# Patient Record
Sex: Female | Born: 1995 | Race: White | Hispanic: No | Marital: Single | Smoking: Current every day smoker
Health system: Southern US, Community
[De-identification: ages and names within clinical notes are randomized; demographics above are authoritative.]

## PROBLEM LIST (undated history)

## (undated) DIAGNOSIS — F79 Unspecified intellectual disabilities: Secondary | ICD-10-CM

## (undated) DIAGNOSIS — F4310 Post-traumatic stress disorder, unspecified: Secondary | ICD-10-CM

## (undated) DIAGNOSIS — F32A Depression, unspecified: Secondary | ICD-10-CM

## (undated) DIAGNOSIS — F419 Anxiety disorder, unspecified: Secondary | ICD-10-CM

## (undated) DIAGNOSIS — F329 Major depressive disorder, single episode, unspecified: Secondary | ICD-10-CM

## (undated) DIAGNOSIS — J189 Pneumonia, unspecified organism: Secondary | ICD-10-CM

## (undated) DIAGNOSIS — M25561 Pain in right knee: Secondary | ICD-10-CM

## (undated) HISTORY — DX: Pain in right knee: M25.561

## (undated) HISTORY — PX: MYRINGOTOMY: SUR874

---
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
## 2013-03-05 ENCOUNTER — Ambulatory Visit (INDEPENDENT_AMBULATORY_CARE_PROVIDER_SITE_OTHER): Payer: Medicaid Other | Admitting: General Practice

## 2013-03-05 ENCOUNTER — Encounter: Payer: Self-pay | Admitting: General Practice

## 2013-03-05 VITALS — BP 120/79 | HR 92 | Temp 98.0°F | Ht 63.0 in | Wt 103.0 lb

## 2013-03-05 DIAGNOSIS — M79641 Pain in right hand: Secondary | ICD-10-CM

## 2013-03-05 DIAGNOSIS — M79609 Pain in unspecified limb: Secondary | ICD-10-CM

## 2013-03-05 DIAGNOSIS — J45909 Unspecified asthma, uncomplicated: Secondary | ICD-10-CM

## 2013-03-05 MED ORDER — ALBUTEROL SULFATE HFA 108 (90 BASE) MCG/ACT IN AERS: 2.0000 | INHALATION_SPRAY | Freq: Four times a day (QID) | RESPIRATORY_TRACT | Status: DC | PRN

## 2013-03-05 NOTE — Progress Notes (Signed)
  Subjective:    Patient ID: Yvonne Meza, female    DOB: [birth year], 17 y.o.   MRN: [id number]  HPI Patient presents today with complaints of a knot on right hand 3rd finger. She reports hitting a wall three weeks ago, had some swelling which resolved. Then knot appeared 5 days ago and having some pain. She reports being able to make a fist, but painful. Denies numbness or tingling. Reports having asthma which is controlled with inhaler.     Review of Systems  Constitutional: Negative for fever and chills.  Respiratory: Negative for chest tightness and shortness of breath.   Cardiovascular: Negative for chest pain.  Musculoskeletal:       Right hand middle finger knot       Objective:   Physical Exam  Constitutional: She is oriented to person, place, and time. She appears well-developed and well-nourished.  Cardiovascular: Normal rate, regular rhythm and normal heart sounds.   Pulmonary/Chest: Effort normal and breath sounds normal.  Musculoskeletal: She exhibits tenderness.  Tenderness with palpation noted to right hand 3rd finger. Capillary refill less than 3 seconds. Able to make a fist.   Neurological: She is alert and oriented to person, place, and time.  Skin: Skin is warm and dry.  Psychiatric: She has a normal mood and affect.          Assessment & Plan:  1. Asthma - albuterol (VENTOLIN HFA) 108 (90 BASE) MCG/ACT inhaler; Inhale 2 puffs into the lungs every 6 (six) hours as needed. For shortness of breath  Dispense: 1 Inhaler; Refill: 6  2. Right hand pain - DG Hand Complete Right; Future (to return tomorrow for this xray) -refrain from strenuous activity if causes pain or discomfort -RTO if symptoms worsen, may seek emergency medical treatment -Patient verbalized understanding -Coralie Keens, FNP-C

## 2013-03-06 ENCOUNTER — Ambulatory Visit (INDEPENDENT_AMBULATORY_CARE_PROVIDER_SITE_OTHER): Payer: Medicaid Other | Admitting: *Deleted

## 2013-03-06 ENCOUNTER — Encounter: Payer: Self-pay | Admitting: Family Medicine

## 2013-03-06 DIAGNOSIS — M79609 Pain in unspecified limb: Secondary | ICD-10-CM

## 2013-03-06 DIAGNOSIS — M79641 Pain in right hand: Secondary | ICD-10-CM

## 2013-03-13 ENCOUNTER — Ambulatory Visit (INDEPENDENT_AMBULATORY_CARE_PROVIDER_SITE_OTHER): Payer: Medicaid Other | Admitting: General Practice

## 2013-03-13 ENCOUNTER — Encounter: Payer: Self-pay | Admitting: General Practice

## 2013-03-13 VITALS — BP 128/87 | HR 92 | Temp 98.5°F | Ht 63.0 in | Wt 106.0 lb

## 2013-03-13 DIAGNOSIS — M79644 Pain in right finger(s): Secondary | ICD-10-CM

## 2013-03-13 DIAGNOSIS — M79609 Pain in unspecified limb: Secondary | ICD-10-CM

## 2013-03-13 DIAGNOSIS — J45909 Unspecified asthma, uncomplicated: Secondary | ICD-10-CM

## 2013-03-13 NOTE — Progress Notes (Signed)
  Subjective:    Patient ID: Yvonne Meza, female    DOB: [birth year], 17 y.o.   MRN: [id number]  HPI Patient presents today for asthma action plan. She also reports right hand 3rd finger continues to be painful at times. She reports being able to make a fist. Also soaking in warm salt water soaks, taking naproxen and resting hand. Reports being seen at [facility] in the past for knee issues and would like to go back there.     Review of Systems  Constitutional: Negative for fever and chills.  Respiratory: Negative for cough, chest tightness and wheezing.   Cardiovascular: Negative for chest pain.  Musculoskeletal:       Right hand 3rd finger pain  Neurological: Negative for dizziness, weakness and headaches.       Objective:   Physical Exam  Constitutional: She is oriented to person, place, and time. She appears well-developed and well-nourished.  HENT:  Head: Normocephalic and atraumatic.  Right Ear: External ear normal.  Left Ear: External ear normal.  Nose: Nose normal.  Mouth/Throat: Oropharynx is clear and moist.  Eyes: EOM are normal. Pupils are equal, round, and reactive to light.  Neck: Normal range of motion. Neck supple. No thyromegaly present.  Cardiovascular: Normal rate, regular rhythm and normal heart sounds.   Pulmonary/Chest: Effort normal and breath sounds normal. No respiratory distress. She exhibits no tenderness.  Abdominal: Soft. Bowel sounds are normal. She exhibits no distension. There is no tenderness.  Musculoskeletal: She exhibits tenderness.  Tenderness upon palpation to right hand 3rd finger, less than 3 seconds capillary refill noted  Lymphadenopathy:    She has no cervical adenopathy.  Neurological: She is alert and oriented to person, place, and time.  Skin: Skin is warm and dry.  Psychiatric: She has a normal mood and affect.          Assessment & Plan:  1. Asthma - albuterol (VENTOLIN HFA) 108 (90 BASE) MCG/ACT inhaler; Inhale 2  puffs into the lungs every 6 (six) hours as needed. For shortness of breath  Dispense: 1 Inhaler; Refill: 6  2. Pain in finger of right hand - Ambulatory referral to Orthopedic Surgery -RTO if symptoms worsen  -may seek emergency medical treatment -Patient verbalized understanding -Coralie Keens, FNP-C

## 2013-03-14 MED ORDER — ALBUTEROL SULFATE HFA 108 (90 BASE) MCG/ACT IN AERS: 2.0000 | INHALATION_SPRAY | Freq: Four times a day (QID) | RESPIRATORY_TRACT | Status: DC | PRN

## 2013-10-13 ENCOUNTER — Ambulatory Visit (INDEPENDENT_AMBULATORY_CARE_PROVIDER_SITE_OTHER): Payer: Medicaid Other | Admitting: Family Medicine

## 2013-10-13 ENCOUNTER — Encounter: Payer: Self-pay | Admitting: Family Medicine

## 2013-10-13 VITALS — BP 115/77 | HR 78 | Temp 98.0°F | Ht 62.25 in | Wt 103.0 lb

## 2013-10-13 DIAGNOSIS — Z025 Encounter for examination for participation in sport: Secondary | ICD-10-CM

## 2013-10-13 DIAGNOSIS — Z0289 Encounter for other administrative examinations: Secondary | ICD-10-CM

## 2013-10-13 NOTE — Progress Notes (Signed)
Yvonne Meza is a 18 y.o. female who is here for a sports physical with her aunt.   To play volleyball Baseline hx/o ?mild MR and mood disorder. Followed by outpt psych.  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  Baseline asthma and intermittent smoker. Uses inhaler prior to exercise. Otherwise minimal use.   No family history of sickle cell disease. No family history of sudden cardiac death. No current medical concerns. Noted prior hx/o knee pain. Evaluated by ortho. No current knee complaints, though would like refill on pain medication for knee.  No history of concussion.  PHYSICAL EXAM:  Vital signs noted. HEENT: Within normal limits Neck: Within normal limits Lungs: Clear Heart: Regular rate and rhythm without murmur. Within normal limits. Abdomen: Negative Musculoskeletal and spine exam: Within normal limits. GU: (for Males only): Within normal limits. No hernia noted. Skin: Within normal limits  Assessment: Normal sports physical   Plan: Anticipatory guidance discussed with patient and parent(s).          Form completed, to be scanned into EMR chart.          Followup with PCP for ongoing preventive care and immunizations.  Use albuterol prior to exercise.   Follow up with orthopedics.           Please see the sports form for any further details.

## 2014-08-08 ENCOUNTER — Other Ambulatory Visit: Payer: Self-pay | Admitting: General Practice

## 2015-01-18 ENCOUNTER — Telehealth: Payer: Self-pay | Admitting: Nurse Practitioner

## 2015-01-18 NOTE — Telephone Encounter (Signed)
Patient aware.

## 2015-01-18 NOTE — Telephone Encounter (Signed)
Lmtcb, pt NTBS before a referral can be made since her last visit was over a yr ago

## 2015-01-22 ENCOUNTER — Encounter (INDEPENDENT_AMBULATORY_CARE_PROVIDER_SITE_OTHER): Payer: Self-pay

## 2015-01-22 ENCOUNTER — Encounter: Payer: Self-pay | Admitting: Family

## 2015-01-22 ENCOUNTER — Ambulatory Visit (INDEPENDENT_AMBULATORY_CARE_PROVIDER_SITE_OTHER): Payer: Medicaid Other | Admitting: Family

## 2015-01-22 VITALS — BP 116/73 | HR 81 | Temp 97.8°F | Ht 62.25 in | Wt 114.0 lb

## 2015-01-22 DIAGNOSIS — J4520 Mild intermittent asthma, uncomplicated: Secondary | ICD-10-CM | POA: Diagnosis not present

## 2015-01-22 DIAGNOSIS — M25561 Pain in right knee: Secondary | ICD-10-CM | POA: Diagnosis not present

## 2015-01-22 MED ORDER — ALBUTEROL SULFATE HFA 108 (90 BASE) MCG/ACT IN AERS: 2.0000 | INHALATION_SPRAY | Freq: Four times a day (QID) | RESPIRATORY_TRACT | Status: DC | PRN

## 2015-01-22 NOTE — Patient Instructions (Addendum)
Knee Pain The knee is the complex joint between your thigh and your lower leg. It is made up of bones, tendons, ligaments, and cartilage. The bones that make up the knee are:  The femur in the thigh.  The tibia and fibula in the lower leg.  The patella or kneecap riding in the groove on the lower femur. CAUSES  Knee pain is a common complaint with many causes. A few of these causes are:  Injury, such as:  A ruptured ligament or tendon injury.  Torn cartilage.  Medical conditions, such as:  Gout  Arthritis  Infections  Overuse, over training, or overdoing a physical activity. Knee pain can be minor or severe. Knee pain can accompany debilitating injury. Minor knee problems often respond well to self-care measures or get well on their own. More serious injuries may need medical intervention or even surgery. SYMPTOMS The knee is complex. Symptoms of knee problems can vary widely. Some of the problems are:  Pain with movement and weight bearing.  Swelling and tenderness.  Buckling of the knee.  Inability to straighten or extend your knee.  Your knee locks and you cannot straighten it.  Warmth and redness with pain and fever.  Deformity or dislocation of the kneecap. DIAGNOSIS  Determining what is wrong may be very straight forward such as when there is an injury. It can also be challenging because of the complexity of the knee. Tests to make a diagnosis may include:  Your caregiver taking a history and doing a physical exam.  Routine X-rays can be used to rule out other problems. X-rays will not reveal a cartilage tear. Some injuries of the knee can be diagnosed by:  Arthroscopy a surgical technique by which a small video camera is inserted through tiny incisions on the sides of the knee. This procedure is used to examine and repair internal knee joint problems. Tiny instruments can be used during arthroscopy to repair the torn knee cartilage (meniscus).  Arthrography  is a radiology technique. A contrast liquid is directly injected into the knee joint. Internal structures of the knee joint then become visible on X-ray film.  An MRI scan is a non X-ray radiology procedure in which magnetic fields and a computer produce two- or three-dimensional images of the inside of the knee. Cartilage tears are often visible using an MRI scanner. MRI scans have largely replaced arthrography in diagnosing cartilage tears of the knee.  Blood work.  Examination of the fluid that helps to lubricate the knee joint (synovial fluid). This is done by taking a sample out using a needle and a syringe. TREATMENT The treatment of knee problems depends on the cause. Some of these treatments are:  Depending on the injury, proper casting, splinting, surgery, or physical therapy care will be needed.  Give yourself adequate recovery time. Do not overuse your joints. If you begin to get sore during workout routines, back off. Slow down or do fewer repetitions.  For repetitive activities such as cycling or running, maintain your strength and nutrition.  Alternate muscle groups. For example, if you are a weight lifter, work the upper body on one day and the lower body the next.  Either tight or weak muscles do not give the proper support for your knee. Tight or weak muscles do not absorb the stress placed on the knee joint. Keep the muscles surrounding the knee strong.  Take care of mechanical problems.  If you have flat feet, orthotics or special shoes may help.  See your caregiver if you need help.  Arch supports, sometimes with wedges on the inner or outer aspect of the heel, can help. These can shift pressure away from the side of the knee most bothered by osteoarthritis.  A brace called an "unloader" brace also may be used to help ease the pressure on the most arthritic side of the knee.  If your caregiver has prescribed crutches, braces, wraps or ice, use as directed. The acronym  for this is PRICE. This means protection, rest, ice, compression, and elevation.  Nonsteroidal anti-inflammatory drugs (NSAIDs), can help relieve pain. But if taken immediately after an injury, they may actually increase swelling. Take NSAIDs with food in your stomach. Stop them if you develop stomach problems. Do not take these if you have a history of ulcers, stomach pain, or bleeding from the bowel. Do not take without your caregiver's approval if you have problems with fluid retention, heart failure, or kidney problems.  For ongoing knee problems, physical therapy may be helpful.  Glucosamine and chondroitin are over-the-counter dietary supplements. Both may help relieve the pain of osteoarthritis in the knee. These medicines are different from the usual anti-inflammatory drugs. Glucosamine may decrease the rate of cartilage destruction.  Injections of a corticosteroid drug into your knee joint may help reduce the symptoms of an arthritis flare-up. They may provide pain relief that lasts a few months. You may have to wait a few months between injections. The injections do have a small increased risk of infection, water retention, and elevated blood sugar levels.  Hyaluronic acid injected into damaged joints may ease pain and provide lubrication. These injections may work by reducing inflammation. A series of shots may give relief for as long as 6 months.  Topical painkillers. Applying certain ointments to your skin may help relieve the pain and stiffness of osteoarthritis. Ask your pharmacist for suggestions. Many over the-counter products are approved for temporary relief of arthritis pain.  In some countries, doctors often prescribe topical NSAIDs for relief of chronic conditions such as arthritis and tendinitis. A review of treatment with NSAID creams found that they worked as well as oral medications but without the serious side effects. PREVENTION  Maintain a healthy weight. Extra pounds  put more strain on your joints.  Get strong, stay limber. Weak muscles are a common cause of knee injuries. Stretching is important. Include flexibility exercises in your workouts.  Be smart about exercise. If you have osteoarthritis, chronic knee pain or recurring injuries, you may need to change the way you exercise. This does not mean you have to stop being active. If your knees ache after jogging or playing basketball, consider switching to swimming, water aerobics, or other low-impact activities, at least for a few days a week. Sometimes limiting high-impact activities will provide relief.  Make sure your shoes fit well. Choose footwear that is right for your sport.  Protect your knees. Use the proper gear for knee-sensitive activities. Use kneepads when playing volleyball or laying carpet. Buckle your seat belt every time you drive. Most shattered kneecaps occur in car accidents.  Rest when you are tired. SEEK MEDICAL CARE IF:  You have knee pain that is continual and does not seem to be getting better.  SEEK IMMEDIATE MEDICAL CARE IF:  Your knee joint feels hot to the touch and you have a high fever. MAKE SURE YOU:   Understand these instructions.  Will watch your condition.  Will get help right away if you are not  doing well or get worse. Document Released: 04/23/2007 Document Revised: 09/18/2011 Document Reviewed: 04/23/2007 [facility] Patient Information 2015 [place], [place]. This information is not intended to replace advice given to you by your health care provider. Make sure you discuss any questions you have with your health care provider. Asthma Attack Prevention Although there is no way to prevent asthma from starting, you can take steps to control the disease and reduce its symptoms. Learn about your asthma and how to control it. Take an active role to control your asthma by working with your health care provider to create and follow an asthma action plan. An asthma action  plan guides you in:  Taking your medicines properly.  Avoiding things that set off your asthma or make your asthma worse (asthma triggers).  Tracking your level of asthma control.  Responding to worsening asthma.  Seeking emergency care when needed. To track your asthma, keep records of your symptoms, check your peak flow number using a handheld device that shows how well air moves out of your lungs (peak flow meter), and get regular asthma checkups.  WHAT ARE SOME WAYS TO PREVENT AN ASTHMA ATTACK?  Take medicines as directed by your health care provider.  Keep track of your asthma symptoms and level of control.  With your health care provider, write a detailed plan for taking medicines and managing an asthma attack. Then be sure to follow your action plan. Asthma is an ongoing condition that needs regular monitoring and treatment.  Identify and avoid asthma triggers. Many outdoor allergens and irritants (such as pollen, mold, cold air, and air pollution) can trigger asthma attacks. Find out what your asthma triggers are and take steps to avoid them.  Monitor your breathing. Learn to recognize warning signs of an attack, such as coughing, wheezing, or shortness of breath. Your lung function may decrease before you notice any signs or symptoms, so regularly measure and record your peak airflow with a home peak flow meter.  Identify and treat attacks early. If you act quickly, you are less likely to have a severe attack. You will also need less medicine to control your symptoms. When your peak flow measurements decrease and alert you to an upcoming attack, take your medicine as instructed and immediately stop any activity that may have triggered the attack. If your symptoms do not improve, get medical help.  Pay attention to increasing quick-relief inhaler use. If you find yourself relying on your quick-relief inhaler, your asthma is not under control. See your health care provider about  adjusting your treatment. WHAT CAN MAKE MY SYMPTOMS WORSE? A number of common things can set off or make your asthma symptoms worse and cause temporary increased inflammation of your airways. Keep track of your asthma symptoms for several weeks, detailing all the environmental and emotional factors that are linked with your asthma. When you have an asthma attack, go back to your asthma diary to see which factor, or combination of factors, might have contributed to it. Once you know what these factors are, you can take steps to control many of them. If you have allergies and asthma, it is important to take asthma prevention steps at home. Minimizing contact with the substance to which you are allergic will help prevent an asthma attack. Some triggers and ways to avoid these triggers are: Animal Dander:  Some people are allergic to the flakes of skin or dried saliva from animals with fur or feathers.   There is no such thing as  a hypoallergenic dog or cat breed. All dogs or cats can cause allergies, even if they don't shed.  Keep these pets out of your home.  If you are not able to keep a pet outdoors, keep the pet out of your bedroom and other sleeping areas at all times, and keep the door closed.  Remove carpets and furniture covered with cloth from your home. If that is not possible, keep the pet away from fabric-covered furniture and carpets. Dust Mites: Many people with asthma are allergic to dust mites. Dust mites are tiny bugs that are found in every home in mattresses, pillows, carpets, fabric-covered furniture, bedcovers, clothes, stuffed toys, and other fabric-covered items.   Cover your mattress in a special dust-proof cover.  Cover your pillow in a special dust-proof cover, or wash the pillow each week in hot water. Water must be hotter than 130 F (54.4 C) to kill dust mites. Cold or warm water used with detergent and bleach can also be effective.  Wash the sheets and blankets on your  bed each week in hot water.  Try not to sleep or lie on cloth-covered cushions.  Call ahead when traveling and ask for a smoke-free hotel room. Bring your own bedding and pillows in case the hotel only supplies feather pillows and down comforters, which may contain dust mites and cause asthma symptoms.  Remove carpets from your bedroom and those laid on concrete, if you can.  Keep stuffed toys out of the bed, or wash the toys weekly in hot water or cooler water with detergent and bleach. Cockroaches: Many people with asthma are allergic to the droppings and remains of cockroaches.   Keep food and garbage in closed containers. Never leave food out.  Use poison baits, traps, powders, gels, or paste (for example, boric acid).  If a spray is used to kill cockroaches, stay out of the room until the odor goes away. Indoor Mold:  Fix leaky faucets, pipes, or other sources of water that have mold around them.  Clean floors and moldy surfaces with a fungicide or diluted bleach.  Avoid using humidifiers, vaporizers, or swamp coolers. These can spread molds through the air. Pollen and Outdoor Mold:  When pollen or mold spore counts are high, try to keep your windows closed.  Stay indoors with windows closed from late morning to afternoon. Pollen and some mold spore counts are highest at that time.  Ask your health care provider whether you need to take anti-inflammatory medicine or increase your dose of the medicine before your allergy season starts. Other Irritants to Avoid:  Tobacco smoke is an irritant. If you smoke, ask your health care provider how you can quit. Ask family members to quit smoking, too. Do not allow smoking in your home or car.  If possible, do not use a wood-burning stove, kerosene heater, or fireplace. Minimize exposure to all sources of smoke, including incense, candles, fires, and fireworks.  Try to stay away from strong odors and sprays, such as perfume, talcum  powder, hair spray, and paints.  Decrease humidity in your home and use an indoor air cleaning device. Reduce indoor humidity to below 60%. Dehumidifiers or central air conditioners can do this.  Decrease house dust exposure by changing furnace and air cooler filters frequently.  Try to have someone else vacuum for you once or twice a week. Stay out of rooms while they are being vacuumed and for a short while afterward.  If you vacuum, use a dust  mask from a hardware store, a double-layered or microfilter vacuum cleaner bag, or a vacuum cleaner with a HEPA filter.  Sulfites in foods and beverages can be irritants. Do not drink beer or wine or eat dried fruit, processed potatoes, or shrimp if they cause asthma symptoms.  Cold air can trigger an asthma attack. Cover your nose and mouth with a scarf on cold or windy days.  Several health conditions can make asthma more difficult to manage, including a runny nose, sinus infections, reflux disease, psychological stress, and sleep apnea. Work with your health care provider to manage these conditions.  Avoid close contact with people who have a respiratory infection such as a cold or the flu, since your asthma symptoms may get worse if you catch the infection. Wash your hands thoroughly after touching items that may have been handled by people with a respiratory infection.  Get a flu shot every year to protect against the flu virus, which often makes asthma worse for days or weeks. Also get a pneumonia shot if you have not previously had one. Unlike the flu shot, the pneumonia shot does not need to be given yearly. Medicines:  Talk to your health care provider about whether it is safe for you to take aspirin or non-steroidal anti-inflammatory medicines (NSAIDs). In a small number of people with asthma, aspirin and NSAIDs can cause asthma attacks. These medicines must be avoided by people who have known aspirin-sensitive asthma. It is important that  people with aspirin-sensitive asthma read labels of all over-the-counter medicines used to treat pain, colds, coughs, and fever.  Beta-blockers and ACE inhibitors are other medicines you should discuss with your health care provider. HOW CAN I FIND OUT WHAT I AM ALLERGIC TO? Ask your asthma health care provider about allergy skin testing or blood testing (the RAST test) to identify the allergens to which you are sensitive. If you are found to have allergies, the most important thing to do is to try to avoid exposure to any allergens that you are sensitive to as much as possible. Other treatments for allergies, such as medicines and allergy shots (immunotherapy) are available.  CAN I EXERCISE? Follow your health care provider's advice regarding asthma treatment before exercising. It is important to maintain a regular exercise program, but vigorous exercise or exercise in cold, humid, or dry environments can cause asthma attacks, especially for those people who have exercise-induced asthma. Document Released: 06/14/2009 Document Revised: 07/01/2013 Document Reviewed: 01/01/2013 [facility] Patient Information 2015 [place], [place]. This information is not intended to replace advice given to you by your health care provider. Make sure you discuss any questions you have with your health care provider.

## 2015-01-22 NOTE — Progress Notes (Signed)
Subjective:    Patient ID: Yvonne Meza, female    DOB: 1996/05/23, 19 y.o.   MRN: [id number]  Pt presents to the office for chronic knee pain. Mother states "bad knees" run in the family and she wants the pt to see an orthopedic. Pt states whenever she plays soccer her right knee hurts. Pt states she fell on it "years ago", and ever since then she has flare ups. Pt states her pain is intermittent 8 out 10.  Knee Pain  The incident occurred more than 1 week ago. The pain is present in the right knee. The pain is at a severity of 8/10. The pain is moderate. The pain has been intermittent since onset. She has tried NSAIDs for the symptoms. The treatment provided mild relief.  Asthma She complains of difficulty breathing ("a little"), frequent throat clearing and wheezing. There is no cough or shortness of breath. This is a chronic problem. The current episode started more than 1 year ago. The problem occurs intermittently. Pertinent negatives include no chest pain or headaches. Her symptoms are aggravated by pollen. Her symptoms are alleviated by rest and ipratropium. She reports significant improvement on treatment. Her past medical history is significant for asthma.      Review of Systems  Constitutional: Negative.   HENT: Negative.   Eyes: Negative.   Respiratory: Positive for wheezing. Negative for cough and shortness of breath.   Cardiovascular: Negative.  Negative for chest pain and palpitations.  Gastrointestinal: Negative.   Endocrine: Negative.   Genitourinary: Negative.   Musculoskeletal: Negative.   Neurological: Negative.  Negative for headaches.  Hematological: Negative.   Psychiatric/Behavioral: Negative.   All other systems reviewed and are negative.      Objective:   Physical Exam  Constitutional: She is oriented to person, place, and time. She appears well-developed and well-nourished. No distress.  HENT:  Head: Normocephalic and atraumatic.  Right Ear:  External ear normal.  Left Ear: External ear normal.  Nose: Nose normal.  Mouth/Throat: Oropharynx is clear and moist.  Eyes: Pupils are equal, round, and reactive to light.  Neck: Normal range of motion. Neck supple. No thyromegaly present.  Cardiovascular: Normal rate, regular rhythm, normal heart sounds and intact distal pulses.   No murmur heard. Pulmonary/Chest: Effort normal and breath sounds normal. No respiratory distress. She has no wheezes.  Abdominal: Soft. Bowel sounds are normal. She exhibits no distension. There is no tenderness.  Musculoskeletal: Normal range of motion. She exhibits no edema or tenderness.  Neurological: She is alert and oriented to person, place, and time. She has normal reflexes. No cranial nerve deficit.  Skin: Skin is warm and dry.  Psychiatric: She has a normal mood and affect. Her behavior is normal. Judgment and thought content normal.  Vitals reviewed.   BP 116/73 mmHg  Pulse 81  Temp(Src) 97.8 F (36.6 C) (Oral)  Ht 5' 2.25" (1.581 m)  Wt 114 lb (51.71 kg)  BMI 20.69 kg/m2  LMP 01/15/2015 (Approximate)       Assessment & Plan:  1. Right knee pain -Rest -Motrin as needed for pain -I do not believe there is any injury or problems- but mom would like referral to ortho -RTO prn - Ambulatory referral to Orthopedic Surgery   2. Asthma, mild intermittent, uncomplicated -Avoid allergens when possible - albuterol (VENTOLIN HFA) 108 (90 BASE) MCG/ACT inhaler; Inhale 2 puffs into the lungs every 6 (six) hours as needed. For shortness of breath  Dispense: 1 Inhaler; Refill: 6  Evelina Dun, FNP

## 2015-03-29 ENCOUNTER — Telehealth: Payer: Self-pay | Admitting: Family

## 2015-04-02 ENCOUNTER — Ambulatory Visit (INDEPENDENT_AMBULATORY_CARE_PROVIDER_SITE_OTHER): Payer: Medicaid Other | Admitting: Physician Assistant

## 2015-04-02 ENCOUNTER — Encounter: Payer: Self-pay | Admitting: Physician Assistant

## 2015-04-02 VITALS — BP 128/81 | HR 93 | Ht 62.25 in | Wt 106.0 lb

## 2015-04-02 DIAGNOSIS — Z0279 Encounter for issue of other medical certificate: Secondary | ICD-10-CM

## 2015-04-02 DIAGNOSIS — Z0189 Encounter for other specified special examinations: Secondary | ICD-10-CM

## 2015-04-02 NOTE — Telephone Encounter (Signed)
Pt is being seen today, to have form filled out

## 2015-04-09 ENCOUNTER — Encounter: Payer: Self-pay | Admitting: Physician Assistant

## 2015-04-09 NOTE — Progress Notes (Signed)
Patient ID: Yvonne Meza, female   DOB: 1996-06-15, 19 y.o.   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 Her parents have guardianship.   [name] presents with her female friend, who she lives with. During visit, I am told by her and her friend that the parents receive her social security check and claims that the patient do not use the money for purposes related to the patient. [name] does not feel that her parents should receive the check or be guardian because she feels she is capable of managing her own finances. She supports this by stating that she graduated from high school and should be able to make her own decisions.   [name] was able to answer all questions asked and appeared mentally competent during the exam. Her answers were reasonable and did not appear out of context.   This was my first time meeting [name]. Based on today's exam, I feel that she is mentally competent, however, further evaluation may be needed.   Tiffany A. Chauncey Reading PA-C

## 2015-06-12 ENCOUNTER — Ambulatory Visit: Payer: Medicaid Other

## 2015-06-14 ENCOUNTER — Ambulatory Visit: Payer: Medicaid Other | Admitting: Family

## 2015-06-18 ENCOUNTER — Ambulatory Visit: Payer: Medicaid Other | Admitting: Pediatrics

## 2015-06-21 ENCOUNTER — Encounter: Payer: Self-pay | Admitting: Family

## 2015-07-01 ENCOUNTER — Emergency Department (HOSPITAL_COMMUNITY)
Admission: EM | Admit: 2015-07-01 | Discharge: 2015-07-01 | Disposition: A | Discharge status: Home / Self Care | Payer: Medicaid Other | Attending: Emergency Medicine | Admitting: Emergency Medicine

## 2015-07-01 ENCOUNTER — Encounter (HOSPITAL_COMMUNITY): Payer: Self-pay | Admitting: *Deleted

## 2015-07-01 DIAGNOSIS — N12 Tubulo-interstitial nephritis, not specified as acute or chronic: Secondary | ICD-10-CM | POA: Diagnosis not present

## 2015-07-01 DIAGNOSIS — R000 Tachycardia, unspecified: Secondary | ICD-10-CM | POA: Diagnosis not present

## 2015-07-01 DIAGNOSIS — F17210 Nicotine dependence, cigarettes, uncomplicated: Secondary | ICD-10-CM | POA: Diagnosis not present

## 2015-07-01 DIAGNOSIS — J45909 Unspecified asthma, uncomplicated: Secondary | ICD-10-CM | POA: Diagnosis not present

## 2015-07-01 DIAGNOSIS — Z8659 Personal history of other mental and behavioral disorders: Secondary | ICD-10-CM | POA: Insufficient documentation

## 2015-07-01 DIAGNOSIS — Z8701 Personal history of pneumonia (recurrent): Secondary | ICD-10-CM | POA: Diagnosis not present

## 2015-07-01 DIAGNOSIS — R319 Hematuria, unspecified: Secondary | ICD-10-CM | POA: Diagnosis present

## 2015-07-01 LAB — URINE MICROSCOPIC-ADD ON

## 2015-07-01 LAB — CBC WITH DIFFERENTIAL/PLATELET
Basophils Absolute: 0 10*3/uL (ref 0.0–0.1)
Basophils Relative: 0 %
EOS PCT: 0 %
Eosinophils Absolute: 0 10*3/uL (ref 0.0–0.7)
HCT: 42.7 % (ref 36.0–46.0)
HEMOGLOBIN: 14.4 g/dL (ref 12.0–15.0)
LYMPHS ABS: 2.3 10*3/uL (ref 0.7–4.0)
LYMPHS PCT: 15 %
MCH: 32.1 pg (ref 26.0–34.0)
MCHC: 33.7 g/dL (ref 30.0–36.0)
MCV: 95.3 fL (ref 78.0–100.0)
MONOS PCT: 7 %
Monocytes Absolute: 1.1 10*3/uL — ABNORMAL HIGH (ref 0.1–1.0)
NEUTROS ABS: 12.2 10*3/uL — AB (ref 1.7–7.7)
NEUTROS PCT: 78 %
Platelets: 299 10*3/uL (ref 150–400)
RBC: 4.48 MIL/uL (ref 3.87–5.11)
RDW: 12.8 % (ref 11.5–15.5)
WBC: 15.7 10*3/uL — AB (ref 4.0–10.5)

## 2015-07-01 LAB — URINALYSIS, ROUTINE W REFLEX MICROSCOPIC
Bilirubin Urine: NEGATIVE
Glucose, UA: NEGATIVE mg/dL
Ketones, ur: NEGATIVE mg/dL
NITRITE: NEGATIVE
PH: 6 (ref 5.0–8.0)
Protein, ur: NEGATIVE mg/dL
SPECIFIC GRAVITY, URINE: 1.01 (ref 1.005–1.030)

## 2015-07-01 LAB — BASIC METABOLIC PANEL
Anion gap: 7 (ref 5–15)
BUN: 9 mg/dL (ref 6–20)
CHLORIDE: 107 mmol/L (ref 101–111)
CO2: 23 mmol/L (ref 22–32)
Calcium: 9.3 mg/dL (ref 8.9–10.3)
Creatinine, Ser: 0.6 mg/dL (ref 0.44–1.00)
GFR calc Af Amer: >60 mL/min (ref >60)
GFR calc non Af Amer: >60 mL/min (ref >60)
GLUCOSE: 120 mg/dL — AB (ref 65–99)
POTASSIUM: 3.6 mmol/L (ref 3.5–5.1)
Sodium: 137 mmol/L (ref 135–145)

## 2015-07-01 MED ORDER — PHENAZOPYRIDINE HCL 200 MG PO TABS: 200.0000 mg | ORAL_TABLET | Freq: Three times a day (TID) | ORAL | Status: DC

## 2015-07-01 MED ORDER — PHENAZOPYRIDINE HCL 100 MG PO TABS
200.0000 mg | ORAL_TABLET | Freq: Once | ORAL | Status: AC
  Administered 2015-07-01: 200 mg via ORAL
  Filled 2015-07-01: qty 2

## 2015-07-01 MED ORDER — SODIUM CHLORIDE 0.9 % IV BOLUS (SEPSIS)
500.0000 mL | Freq: Once | INTRAVENOUS | Status: AC
  Administered 2015-07-01: 500 mL via INTRAVENOUS

## 2015-07-01 MED ORDER — ONDANSETRON 4 MG PO TBDP: 4.0000 mg | ORAL_TABLET | Freq: Three times a day (TID) | ORAL | Status: DC | PRN

## 2015-07-01 MED ORDER — CEPHALEXIN 500 MG PO CAPS: 500.0000 mg | ORAL_CAPSULE | Freq: Three times a day (TID) | ORAL | Status: DC

## 2015-07-01 MED ORDER — ONDANSETRON HCL 4 MG/2ML IJ SOLN
4.0000 mg | Freq: Once | INTRAMUSCULAR | Status: AC
  Administered 2015-07-01: 4 mg via INTRAVENOUS
  Filled 2015-07-01: qty 2

## 2015-07-01 MED ORDER — DEXTROSE 5 % IV SOLN
1.0000 g | Freq: Once | INTRAVENOUS | Status: AC
  Administered 2015-07-01: 1 g via INTRAVENOUS
  Filled 2015-07-01: qty 10

## 2015-07-01 NOTE — ED Notes (Signed)
Pt states, " I have blood in my urine."  Pt also states abdominal pain. Pt also states lightheadedness at times but not at present.

## 2015-07-01 NOTE — ED Provider Notes (Signed)
CSN: [id number]     Arrival date & time 07/01/15  1733 History   First MD Initiated Contact with Patient 07/01/15 1740     Chief Complaint  Patient presents with  . Hematuria  . Abdominal Pain     (Consider location/radiation/quality/duration/timing/severity/associated sxs/prior Treatment) Patient is a 19 y.o. female presenting with dysuria. The history is provided by the patient.  Dysuria Pain quality:  Burning Pain severity:  Moderate Onset quality:  Gradual Duration:  2 days Timing:  Intermittent Progression:  Worsening Chronicity:  New Recent urinary tract infections: yes   Relieved by:  Nothing Worsened by:  Nothing tried Ineffective treatments:  None tried Urinary symptoms: discolored urine, foul-smelling urine, frequent urination and hematuria   Urinary symptoms: no bladder incontinence   Associated symptoms: fever (low grade), flank pain and nausea   Associated symptoms: no genital lesions, no vaginal discharge and no vomiting   Risk factors: no hx of pyelonephritis, no hx of urolithiasis, not pregnant and not sexually active    Yvonne Meza is a 19 y.o. female who presents to the ED with hematuria and dysuria that stated yesterday. Last UTI was in November. Patient denies being sexually active and denies vaginal bleeding or discharge.   Past Medical History  Diagnosis Date  . Asthma   . Pneumonia     episode as a child  . MR (mental retardation)   . Depression   . Right knee pain    Past Surgical History  Procedure Laterality Date  . Myringotomy     Family History  Problem Relation Age of Onset  . Diabetes Other   . Heart failure Other   . Diabetes Mother   . Hypertension Mother   . Hyperlipidemia Mother   . Fibromyalgia Mother    Social History  Substance Use Topics  . Smoking status: Current Every Day Smoker -- 0.25 packs/day for 7 years    Types: Cigarettes  . Smokeless tobacco: Never Used  . Alcohol Use: No   OB History    No data  available     Review of Systems  Constitutional: Positive for fever (low grade) and chills.  HENT: Negative.   Eyes: Negative for pain, redness, itching and visual disturbance.  Respiratory: Negative for cough, shortness of breath and wheezing.   Gastrointestinal: Positive for nausea. Negative for vomiting.  Genitourinary: Positive for dysuria and flank pain. Negative for vaginal discharge.  Musculoskeletal: Positive for back pain.  Skin: Negative for rash.  Neurological: Negative for syncope, light-headedness and headaches.  Psychiatric/Behavioral: Negative for confusion. The patient is not nervous/anxious.       Allergies  Sulfa antibiotics  Home Medications   Prior to Admission medications   Medication Sig Start Date End Date Taking? Authorizing Provider  albuterol (VENTOLIN HFA) 108 (90 BASE) MCG/ACT inhaler Inhale 2 puffs into the lungs every 6 (six) hours as needed. For shortness of breath Patient not taking: Reported on 07/01/2015 01/22/15   Junie Spencer, FNP  cephALEXin (KEFLEX) 500 MG capsule Take 1 capsule (500 mg total) by mouth 3 (three) times daily. 07/01/15   Hope Orlene Och, NP  ondansetron (ZOFRAN ODT) 4 MG disintegrating tablet Take 1 tablet (4 mg total) by mouth every 8 (eight) hours as needed for nausea or vomiting. 07/01/15   Hope Orlene Och, NP  phenazopyridine (PYRIDIUM) 200 MG tablet Take 1 tablet (200 mg total) by mouth 3 (three) times daily. 07/01/15   Hope Orlene Och, NP   BP 115/72 mmHg  Pulse 94  Temp(Src) 98.2 F (36.8 C) (Oral)  Resp 16  Ht  (1.575 m)  Wt 52.164 kg  BMI 21.03 kg/m2  SpO2 100%  LMP 06/09/2015 Physical Exam  Constitutional: She is oriented to person, place, and time. She appears well-developed and well-nourished.  HENT:  Head: Normocephalic and atraumatic.  Eyes: Conjunctivae and EOM are normal. Pupils are equal, round, and reactive to light.  Neck: Normal range of motion. Neck supple.  Cardiovascular: Regular rhythm.   Tachycardia present.   Pulmonary/Chest: Effort normal and breath sounds normal.  Abdominal: Soft. Bowel sounds are normal. There is tenderness in the suprapubic area. There is CVA tenderness (right). There is no rebound and no guarding.  Genitourinary:  Patient declined pelvic exam stating she is not sexually active and no bleeding or discharge.  Musculoskeletal: Normal range of motion.  Neurological: She is alert and oriented to person, place, and time. No cranial nerve deficit.  Skin: Skin is warm and dry.  Psychiatric: She has a normal mood and affect. Her behavior is normal.  Nursing note and vitals reviewed.   ED Course  Procedures (including critical care time) IV fluid bolus of NS, Rocephin 1 gram IV, Zofran 4 mg IV  Labs Review Results for orders placed or performed during the hospital encounter of 07/01/15 (from the past 24 hour(s))  Urinalysis, Routine w reflex microscopic (not at [facility])     Status: Abnormal   Collection Time: 07/01/15  6:00 PM  Result Value Ref Range   Color, Urine YELLOW YELLOW   APPearance HAZY (A) CLEAR   Specific Gravity, Urine 1.010 1.005 - 1.030   pH 6.0 5.0 - 8.0   Glucose, UA NEGATIVE NEGATIVE mg/dL   Hgb urine dipstick LARGE (A) NEGATIVE   Bilirubin Urine NEGATIVE NEGATIVE   Ketones, ur NEGATIVE NEGATIVE mg/dL   Protein, ur NEGATIVE NEGATIVE mg/dL   Nitrite NEGATIVE NEGATIVE   Leukocytes, UA MODERATE (A) NEGATIVE  Urine microscopic-add on     Status: Abnormal   Collection Time: 07/01/15  6:00 PM  Result Value Ref Range   Squamous Epithelial / LPF 6-30 (A) NONE SEEN   WBC, UA TOO NUMEROUS TO COUNT 0 - 5 WBC/hpf   RBC / HPF 6-30 0 - 5 RBC/hpf   Bacteria, UA MANY (A) NONE SEEN  CBC with Differential     Status: Abnormal   Collection Time: 07/01/15  6:02 PM  Result Value Ref Range   WBC 15.7 (H) 4.0 - 10.5 K/uL   RBC 4.48 3.87 - 5.11 MIL/uL   Hemoglobin 14.4 12.0 - 15.0 g/dL   HCT 09.8 11.9 - 14.7 %   MCV 95.3 78.0 - 100.0 fL   MCH 32.1  26.0 - 34.0 pg   MCHC 33.7 30.0 - 36.0 g/dL   RDW 82.9 56.2 - 13.0 %   Platelets 299 150 - 400 K/uL   Neutrophils Relative % 78 %   Neutro Abs 12.2 (H) 1.7 - 7.7 K/uL   Lymphocytes Relative 15 %   Lymphs Abs 2.3 0.7 - 4.0 K/uL   Monocytes Relative 7 %   Monocytes Absolute 1.1 (H) 0.1 - 1.0 K/uL   Eosinophils Relative 0 %   Eosinophils Absolute 0.0 0.0 - 0.7 K/uL   Basophils Relative 0 %   Basophils Absolute 0.0 0.0 - 0.1 K/uL  Basic metabolic panel     Status: Abnormal   Collection Time: 07/01/15  6:02 PM  Result Value Ref Range   Sodium 137 135 -  145 mmol/L   Potassium 3.6 3.5 - 5.1 mmol/L   Chloride 107 101 - 111 mmol/L   CO2 23 22 - 32 mmol/L   Glucose, Bld 120 (H) 65 - 99 mg/dL   BUN 9 6 - 20 mg/dL   Creatinine, Ser 1.610.60 0.44 - 1.00 mg/dL   Calcium 9.3 8.9 - 09.610.3 mg/dL   GFR calc non Af Amer >60 >60 mL/min   GFR calc Af Amer >60 >60 mL/min   Anion gap 7 5 - 15    Patient's vital signs improved after IV fluids, pyridium, zofran and Rocephin.   MDM  19 y.o. female with hematuria, dysuria and right CVAT stable for d/c with outpatient treatment of pyelonephritis without fever and does not appear toxic.  Urine culture pending. Discussed with the patient clinical and lab findings and plan of care and all questioned fully answered. She will return if any problems arise.   Final diagnoses:  Pyelonephritis        Janne NapoleonHope M Neese, NP 07/01/15 2039  Glynn OctaveStephen Rancour, MD 07/02/15 [phone]

## 2015-07-01 NOTE — Discharge Instructions (Signed)
Call the Urologist office for follow up. Return here as needed for worsening symptoms.  We have sent the urine for culture. If we need to change your medication someone will call you.   Pyelonephritis, Adult Pyelonephritis is a kidney infection. The kidneys are the organs that filter a person's blood and move waste out of the bloodstream and into the urine. Urine passes from the kidneys, through the ureters, and into the bladder. There are two main types of pyelonephritis:  Infections that come on quickly without any warning (acute pyelonephritis).  Infections that last for a long period of time (chronic pyelonephritis). In most cases, the infection clears up with treatment and does not cause further problems. More severe infections or chronic infections can sometimes spread to the bloodstream or lead to other problems with the kidneys. CAUSES This condition is usually caused by:  Bacteria traveling from the bladder to the kidney through infected urine. The urine in the bladder can become infected with bacteria from:  Bladder infection (cystitis).  Inflammation of the prostate gland (prostatitis).  Sexual intercourse, in females.  Bacteria traveling from the bloodstream to the kidney. RISK FACTORS This condition is more likely to develop in:  Pregnant women.  Older people.  People who have diabetes.  People who have kidney stones or bladder stones.  People who have other abnormalities of the kidney or ureter.  People who have a catheter placed in the bladder.  People who have cancer.  People who are sexually active.  Women who use spermicides.  People who have had a prior urinary tract infection. SYMPTOMS Symptoms of this condition include:  Frequent urination.  Strong or persistent urge to urinate.  Burning or stinging when urinating.  Abdominal pain.  Back pain.  Pain in the side or flank area.  Fever.  Chills.  Blood in the urine, or dark  urine.  Nausea.  Vomiting. DIAGNOSIS This condition may be diagnosed based on:  Medical history and physical exam.  Urine tests.  Blood tests. You may also have imaging tests of the kidneys, such as an ultrasound or CT scan. TREATMENT Treatment for this condition may depend on the severity of the infection.  If the infection is mild and is found early, you may be treated with antibiotic medicines taken by mouth. You will need to drink fluids to remain hydrated.  If the infection is more severe, you may need to stay in the hospital and receive antibiotics given directly into a vein through an IV tube. You may also need to receive fluids through an IV tube if you are not able to remain hydrated. After your hospital stay, you may need to take oral antibiotics for a period of time. Other treatments may be required, depending on the cause of the infection. HOME CARE INSTRUCTIONS Medicines  Take over-the-counter and prescription medicines only as told by your health care provider.  If you were prescribed an antibiotic medicine, take it as told by your health care provider. Do not stop taking the antibiotic even if you start to feel better. General Instructions  Drink enough fluid to keep your urine clear or pale yellow.  Avoid caffeine, tea, and carbonated beverages. They tend to irritate the bladder.  Urinate often. Avoid holding in urine for long periods of time.  Urinate before and after sex.  After a bowel movement, women should cleanse from front to back. Use each tissue only once.  Keep all follow-up visits as told by your health care provider. This  is important. SEEK MEDICAL CARE IF:  Your symptoms do not get better after 2 days of treatment.  Your symptoms get worse.  You have a fever. SEEK IMMEDIATE MEDICAL CARE IF:  You are unable to take your antibiotics or fluids.  You have shaking chills.  You vomit.  You have severe flank or back pain.  You have  extreme weakness or fainting.   This information is not intended to replace advice given to you by your health care provider. Make sure you discuss any questions you have with your health care provider.   Document Released: 06/26/2005 Document Revised: 03/17/2015 Document Reviewed: 10/19/2014 Elsevier Interactive Patient Education [employer].

## 2015-07-04 LAB — URINE CULTURE

## 2015-07-05 ENCOUNTER — Telehealth: Payer: Self-pay | Admitting: *Deleted

## 2015-07-05 NOTE — ED Notes (Signed)
(+)  Urine Culture, treated with Cephalexin, no further treatment needed, Trixie DredgeEmily West, PA-C

## 2015-07-05 NOTE — ED Notes (Signed)
(+)  Urine culture, treated with Cephalexin, no additional treatment needed, Trixie DredgeEmily West, PA-C

## 2015-07-06 ENCOUNTER — Telehealth: Payer: Self-pay | Admitting: Family

## 2015-07-14 ENCOUNTER — Ambulatory Visit (INDEPENDENT_AMBULATORY_CARE_PROVIDER_SITE_OTHER): Payer: Medicaid Other | Admitting: Pediatrics

## 2015-07-14 ENCOUNTER — Encounter: Payer: Self-pay | Admitting: Pediatrics

## 2015-07-14 VITALS — BP 129/84 | HR 107 | Temp 97.3°F | Ht 62.0 in | Wt 108.6 lb

## 2015-07-14 DIAGNOSIS — R399 Unspecified symptoms and signs involving the genitourinary system: Secondary | ICD-10-CM

## 2015-07-14 DIAGNOSIS — R319 Hematuria, unspecified: Secondary | ICD-10-CM

## 2015-07-14 LAB — POCT UA - MICROSCOPIC ONLY
BACTERIA, U MICROSCOPIC: NEGATIVE
Casts, Ur, LPF, POC: NEGATIVE
Crystals, Ur, HPF, POC: NEGATIVE
Yeast, UA: NEGATIVE

## 2015-07-14 LAB — POCT URINALYSIS DIPSTICK
BILIRUBIN UA: NEGATIVE
Glucose, UA: NEGATIVE
Ketones, UA: NEGATIVE
LEUKOCYTES UA: NEGATIVE
NITRITE UA: NEGATIVE
PH UA: 7.5
Spec Grav, UA: <=1.005
UROBILINOGEN UA: NEGATIVE

## 2015-07-14 NOTE — Progress Notes (Signed)
Subjective:    Patient ID: Yvonne Meza, female    DOB: [birth year], 20 y.o.   MRN: [id number]  CC: Hospitalization Follow-up for UTI  HPI: Yvonne Meza is a 20 y.o. female presenting for Hospitalization Follow-up  Seen in ED for pyelonephritis 2 weeks ago, treated with keflex for 2 weeks for staph epi UTI. Still has a couple days of antibiotics  Still having pink/red urine. Not on period LMP: Nov 2016, periods are irregular, sometimes multiple in a month, sometimes goes two months without one No vaginal bleeding now Still having some chills Still with some back pain Doesn't have thermometer at home, thinks she still has some fevers at times Not feeling back to her normal self yet. Appetite still down Pt not sexually active  Depression screen PHQ 2/9 07/14/2015  Decreased Interest 0  Down, Depressed, Hopeless 0  PHQ - 2 Score 0     Relevant past medical, surgical, family and social history reviewed and updated as indicated. Interim medical history since our last visit reviewed. Allergies and medications reviewed and updated.    ROS: Per HPI unless specifically indicated above  History  Smoking status  . Current Every Day Smoker -- 0.25 packs/day for 7 years  . Types: Cigarettes  Smokeless tobacco  . Never Used    Past Medical History Patient Active Problem List   Diagnosis Date Noted  . Unspecified asthma(493.90) 12/11/2012    Current Outpatient Prescriptions  Medication Sig Dispense Refill  . albuterol (VENTOLIN HFA) 108 (90 BASE) MCG/ACT inhaler Inhale 2 puffs into the lungs every 6 (six) hours as needed. For shortness of breath 1 Inhaler 6  . ondansetron (ZOFRAN ODT) 4 MG disintegrating tablet Take 1 tablet (4 mg total) by mouth every 8 (eight) hours as needed for nausea or vomiting. 20 tablet 0  . phenazopyridine (PYRIDIUM) 200 MG tablet Take 1 tablet (200 mg total) by mouth 3 (three) times daily. 6 tablet 0   No current facility-administered  medications for this visit.       Objective:    BP 129/84 mmHg  Pulse 107  Temp(Src) 97.3 F (36.3 C) (Oral)  Ht _0  (1.575 m)  Wt 108 lb 9.6 oz (49.261 kg)  BMI 19.86 kg/m2  LMP 06/09/2015  Wt Readings from Last 3 Encounters:  07/14/15 108 lb 9.6 oz (49.261 kg)  07/01/15 115 lb (52.164 kg)  04/02/15 106 lb (48.081 kg)     Gen: NAD, alert, cooperative with exam, NCAT EYES: EOMI, no scleral injection or icterus ENT: OP without erythema LYMPH: no cervical LAD CV: NRRR, normal S1/S2, no murmur, distal pulses 2+ b/l Resp: CTABL, no wheezes, normal WOB Abd: +BS, soft, mildly tender throughout, ND. no guarding or organomegaly, mild CVA tenderness b/l Ext: No edema, warm Neuro: Alert and oriented, strength equal b/l UE and LE, coordination grossly normal MSK: normal muscle bulk     Assessment & Plan:    Idora was seen today for hospitalization follow-up, f/u pyelonephrotos. Still not feeling abck to normal self. Noticing pink urine. WIll repeat UA/U culture today, send CBC and BMP. Finish current antibiotics, let me know if worsening. May take another coupel of weeks for urine to clear RBCs after pyelo.   Diagnoses and all orders for this visit:  Hematuria -     Urine culture -     POCT urinalysis dipstick -     POCT UA - Microscopic Only -     BMP8+EGFR -  CBC    Follow up plan: Return in about 4 weeks (around 08/11/2015) for f/u irregular periods, repeat UA to show clearing of RBCs  Assunta Found, MD [facility] 07/14/2015, 12:13 PM

## 2015-07-15 LAB — BMP8+EGFR
BUN / CREAT RATIO: 11 (ref 8–20)
BUN: 6 mg/dL (ref 6–20)
CO2: 22 mmol/L (ref 18–29)
CREATININE: 0.54 mg/dL — AB (ref 0.57–1.00)
Calcium: 9.6 mg/dL (ref 8.7–10.2)
Chloride: 100 mmol/L (ref 96–106)
GFR calc Af Amer: 156 mL/min/{1.73_m2} (ref >59)
GFR, EST NON AFRICAN AMERICAN: 135 mL/min/{1.73_m2} (ref >59)
GLUCOSE: 80 mg/dL (ref 65–99)
POTASSIUM: 4.9 mmol/L (ref 3.5–5.2)
SODIUM: 138 mmol/L (ref 134–144)

## 2015-07-15 LAB — CBC
HEMATOCRIT: 41.1 % (ref 34.0–46.6)
Hemoglobin: 13.6 g/dL (ref 11.1–15.9)
MCH: 31 pg (ref 26.6–33.0)
MCHC: 33.1 g/dL (ref 31.5–35.7)
MCV: 94 fL (ref 79–97)
PLATELETS: 301 10*3/uL (ref 150–379)
RBC: 4.39 x10E6/uL (ref 3.77–5.28)
RDW: 12.6 % (ref 12.3–15.4)
WBC: 7.3 10*3/uL (ref 3.4–10.8)

## 2015-07-15 LAB — URINE CULTURE: Organism ID, Bacteria: NO GROWTH

## 2015-09-06 ENCOUNTER — Encounter (HOSPITAL_COMMUNITY): Payer: Self-pay | Admitting: *Deleted

## 2015-09-06 ENCOUNTER — Emergency Department (HOSPITAL_COMMUNITY)
Admission: EM | Admit: 2015-09-06 | Discharge: 2015-09-06 | Disposition: A | Discharge status: Home / Self Care | Payer: Medicaid Other | Attending: Emergency Medicine | Admitting: Emergency Medicine

## 2015-09-06 DIAGNOSIS — Z8739 Personal history of other diseases of the musculoskeletal system and connective tissue: Secondary | ICD-10-CM | POA: Insufficient documentation

## 2015-09-06 DIAGNOSIS — Z8701 Personal history of pneumonia (recurrent): Secondary | ICD-10-CM | POA: Diagnosis not present

## 2015-09-06 DIAGNOSIS — J45901 Unspecified asthma with (acute) exacerbation: Secondary | ICD-10-CM | POA: Insufficient documentation

## 2015-09-06 DIAGNOSIS — F17210 Nicotine dependence, cigarettes, uncomplicated: Secondary | ICD-10-CM | POA: Insufficient documentation

## 2015-09-06 DIAGNOSIS — J069 Acute upper respiratory infection, unspecified: Secondary | ICD-10-CM | POA: Diagnosis not present

## 2015-09-06 DIAGNOSIS — Z8659 Personal history of other mental and behavioral disorders: Secondary | ICD-10-CM | POA: Insufficient documentation

## 2015-09-06 DIAGNOSIS — R05 Cough: Secondary | ICD-10-CM | POA: Diagnosis present

## 2015-09-06 MED ORDER — GUAIFENESIN-CODEINE 100-10 MG/5ML PO SYRP: 10.0000 mL | ORAL_SOLUTION | Freq: Three times a day (TID) | ORAL | Status: DC | PRN

## 2015-09-06 MED ORDER — ALBUTEROL SULFATE HFA 108 (90 BASE) MCG/ACT IN AERS
1.0000 | INHALATION_SPRAY | Freq: Four times a day (QID) | RESPIRATORY_TRACT | Status: DC | PRN
Start: 2015-09-06 — End: 2017-10-04

## 2015-09-06 MED ORDER — NAPROXEN 500 MG PO TABS: 500.0000 mg | ORAL_TABLET | Freq: Two times a day (BID) | ORAL | Status: DC

## 2015-09-06 NOTE — ED Notes (Signed)
Pt with sore throat and body aches for several weeks- Clear voiced and NAD

## 2015-09-06 NOTE — Discharge Instructions (Signed)

## 2015-09-06 NOTE — ED Notes (Signed)
Pt comes in with cough and sore throat starting last week. Pt denies any n/v/d.

## 2015-09-06 NOTE — ED Provider Notes (Signed)
CSN: [id number]     Arrival date & time 09/06/15  1344 History  By signing my name below, I, Emmanuella Mensah, attest that this documentation has been prepared under the direction and in the presence of Treonna Klee, PA-C. Electronically Signed: Angelene Giovanni, ED Scribe. 09/06/2015. 2:12 PM.      Chief Complaint  Patient presents with  . Cough   Patient is a 20 y.o. female presenting with cough. The history is provided by the patient. No language interpreter was used.  Cough Cough characteristics:  Non-productive Severity:  Moderate Onset quality:  Gradual Duration:  1 week Timing:  Intermittent Progression:  Worsening Chronicity:  New Context: not sick contacts   Relieved by:  Nothing Worsened by:  Nothing tried Ineffective treatments:  Beta-agonist inhaler Associated symptoms: chills, fever, myalgias, shortness of breath and sore throat   Associated symptoms: no chest pain, no diaphoresis, no ear pain and no rash    HPI Comments: Yvonne Meza is a 20 y.o. female with a hx of asthma who presents to the Emergency Department complaining of gradually worsening persistent moderate non-productive cough onset one week ago. She reports associated chills, fever, body aches, sore throat, and SOB associated with the cough. She states that she has been having to use inhaler more often since onset but it does not provide any relief. She denies that she has tried any OTC cold medications today PTA. Pt is a current smoker. She denies any sick contacts. No n/v/d or CP.     Past Medical History  Diagnosis Date  . Asthma   . Pneumonia     episode as a child  . MR (mental retardation)   . Depression   . Right knee pain    Past Surgical History  Procedure Laterality Date  . Myringotomy     Family History  Problem Relation Age of Onset  . Diabetes Other   . Heart failure Other   . Diabetes Mother   . Hypertension Mother   . Hyperlipidemia Mother   . Fibromyalgia Mother     Social History  Substance Use Topics  . Smoking status: Current Every Day Smoker -- 0.25 packs/day for 7 years    Types: Cigarettes  . Smokeless tobacco: Never Used  . Alcohol Use: No   OB History    No data available     Review of Systems  Constitutional: Positive for fever and chills. Negative for diaphoresis.  HENT: Positive for sore throat. Negative for ear pain.   Respiratory: Positive for cough and shortness of breath.   Cardiovascular: Negative for chest pain.  Gastrointestinal: Negative for nausea, vomiting, abdominal pain and diarrhea.  Musculoskeletal: Positive for myalgias.  Skin: Negative for rash.      Allergies  Sulfa antibiotics  Home Medications   Prior to Admission medications   Medication Sig Start Date End Date Taking? Authorizing Provider  albuterol (VENTOLIN HFA) 108 (90 BASE) MCG/ACT inhaler Inhale 2 puffs into the lungs every 6 (six) hours as needed. For shortness of breath 01/22/15   Junie Spencer, FNP  ondansetron (ZOFRAN ODT) 4 MG disintegrating tablet Take 1 tablet (4 mg total) by mouth every 8 (eight) hours as needed for nausea or vomiting. 07/01/15   Hope Orlene Och, NP  phenazopyridine (PYRIDIUM) 200 MG tablet Take 1 tablet (200 mg total) by mouth 3 (three) times daily. 07/01/15   Hope Orlene Och, NP   BP 135/78 mmHg  Pulse 110  Temp(Src) 98.8 F (37.1 C) (Oral)  Resp 16  Ht  (1.6 m)  Wt 117 lb (53.071 kg)  BMI 20.73 kg/m2  SpO2 100%  LMP 08/11/2015 Physical Exam  Constitutional: She is oriented to person, place, and time. She appears well-developed and well-nourished.  HENT:  Head: Normocephalic and atraumatic.  Mouth/Throat: No oropharyngeal exudate.  Oropharynx slightly erythematous, no edema or exudates   Neck: Normal range of motion. Neck supple.  Cardiovascular: Normal rate and normal heart sounds.   Pulmonary/Chest: Effort normal and breath sounds normal. No respiratory distress. She has no wheezes. She has no rales.  Lungs  are clear to ausculation bilaterally, no respiratory distress.    Abdominal: She exhibits no distension.  Musculoskeletal: Normal range of motion.  Lymphadenopathy:    She has no cervical adenopathy.  Neurological: She is alert and oriented to person, place, and time.  Skin: Skin is warm and dry.  Psychiatric: She has a normal mood and affect.  Nursing note and vitals reviewed.   ED Course  Procedures (including critical care time) DIAGNOSTIC STUDIES: Oxygen Saturation is 100% on RA, normal by my interpretation.    COORDINATION OF CARE: 2:09 PM- Pt advised of plan for treatment and pt agrees. Advised to increase fluid intake. Pt will receive prescription for her inhaler.    MDM   Final diagnoses:  URI (upper respiratory infection)    Pt is non-toxic appearing.  Sx's appear c/w URI.  Lungs CTA bilaterally.  She appears stable for d/c and agrees to symptomatic tx with cough syrup, naprosyn for myalgias and refill of her albuterol MDI.  Mild tachycardia likely related to albuterol use.  It was also noted that she was mildly tachycardic on previous PMD visit.  No concerning sx's for PE.  Advised to PMD f/u for recheck   I personally performed the services described in this documentation, which was scribed in my presence. The recorded information has been reviewed and is accurate.   Pauline Aus, PA-C 09/06/15 1542  Blane Ohara, MD 09/07/15 [phone]

## 2015-10-05 ENCOUNTER — Encounter (HOSPITAL_COMMUNITY): Payer: Self-pay | Admitting: Emergency Medicine

## 2015-10-05 ENCOUNTER — Emergency Department (HOSPITAL_COMMUNITY): Payer: Medicaid Other

## 2015-10-05 ENCOUNTER — Emergency Department (HOSPITAL_COMMUNITY)
Admission: EM | Admit: 2015-10-05 | Discharge: 2015-10-05 | Disposition: A | Discharge status: Home / Self Care | Payer: Medicaid Other | Attending: Emergency Medicine | Admitting: Emergency Medicine

## 2015-10-05 DIAGNOSIS — Y929 Unspecified place or not applicable: Secondary | ICD-10-CM | POA: Insufficient documentation

## 2015-10-05 DIAGNOSIS — F329 Major depressive disorder, single episode, unspecified: Secondary | ICD-10-CM | POA: Diagnosis not present

## 2015-10-05 DIAGNOSIS — F17210 Nicotine dependence, cigarettes, uncomplicated: Secondary | ICD-10-CM | POA: Insufficient documentation

## 2015-10-05 DIAGNOSIS — Y999 Unspecified external cause status: Secondary | ICD-10-CM | POA: Insufficient documentation

## 2015-10-05 DIAGNOSIS — S99922A Unspecified injury of left foot, initial encounter: Secondary | ICD-10-CM | POA: Diagnosis present

## 2015-10-05 DIAGNOSIS — J45909 Unspecified asthma, uncomplicated: Secondary | ICD-10-CM | POA: Diagnosis not present

## 2015-10-05 DIAGNOSIS — Y939 Activity, unspecified: Secondary | ICD-10-CM | POA: Insufficient documentation

## 2015-10-05 DIAGNOSIS — W19XXXA Unspecified fall, initial encounter: Secondary | ICD-10-CM | POA: Diagnosis not present

## 2015-10-05 DIAGNOSIS — S93402A Sprain of unspecified ligament of left ankle, initial encounter: Secondary | ICD-10-CM | POA: Insufficient documentation

## 2015-10-05 NOTE — ED Provider Notes (Signed)
CSN: [id number]     Arrival date & time 10/05/15  1423 History   First MD Initiated Contact with Patient 10/05/15 1558     Chief Complaint  Patient presents with  . Foot Injury     (Consider location/radiation/quality/duration/timing/severity/associated sxs/prior Treatment) HPI Comments: Patient is a 20 year old female who presents to the emergency department with a complaint of injury to the left foot.  The patient states that on yesterday March 27 patient had a friend first a fall on the left foot. The patient noted some mild bruising, but has pain with movement and pain with moving the toes of the left foot. She presents now for evaluation and management of this issue. Patient is not on any anticoagulation medications, and has not had any operations or procedures involving the foot.  Patient is a 20 y.o. female presenting with foot injury. The history is provided by the patient.  Foot Injury   Past Medical History  Diagnosis Date  . Asthma   . Pneumonia     episode as a child  . MR (mental retardation)   . Depression   . Right knee pain    Past Surgical History  Procedure Laterality Date  . Myringotomy     Family History  Problem Relation Age of Onset  . Diabetes Other   . Heart failure Other   . Diabetes Mother   . Hypertension Mother   . Hyperlipidemia Mother   . Fibromyalgia Mother    Social History  Substance Use Topics  . Smoking status: Current Every Day Smoker -- 1.00 packs/day for 7 years    Types: Cigarettes  . Smokeless tobacco: Never Used  . Alcohol Use: No   OB History    No data available     Review of Systems  Musculoskeletal:       FOOT PAIN  All other systems reviewed and are negative.     Allergies  Sulfa antibiotics  Home Medications   Prior to Admission medications   Medication Sig Start Date End Date Taking? Authorizing Provider  albuterol (PROVENTIL HFA;VENTOLIN HFA) 108 (90 Base) MCG/ACT inhaler Inhale 1-2 puffs into the lungs  every 6 (six) hours as needed for wheezing or shortness of breath. 09/06/15  Yes Tammy Triplett, PA-C  guaiFENesin-codeine (ROBITUSSIN AC) 100-10 MG/5ML syrup Take 10 mLs by mouth 3 (three) times daily as needed. Patient not taking: Reported on 10/05/2015 09/06/15   Tammy Triplett, PA-C  naproxen (NAPROSYN) 500 MG tablet Take 1 tablet (500 mg total) by mouth 2 (two) times daily with a meal. Patient not taking: Reported on 10/05/2015 09/06/15   Tammy Triplett, PA-C  phenazopyridine (PYRIDIUM) 200 MG tablet Take 1 tablet (200 mg total) by mouth 3 (three) times daily. Patient not taking: Reported on 10/05/2015 07/01/15   Janne NapoleonHope M Neese, NP   BP 115/72 mmHg  Pulse 94  Temp(Src) 98.3 F (36.8 C) (Oral)  Resp 18  Ht 5\' 3"  (1.6 m)  Wt 52.164 kg  BMI 20.38 kg/m2  SpO2 100%  LMP 09/08/2015 Physical Exam  Constitutional: She is oriented to person, place, and time. She appears well-developed and well-nourished.  Non-toxic appearance.  HENT:  Head: Normocephalic.  Right Ear: Tympanic membrane and external ear normal.  Left Ear: Tympanic membrane and external ear normal.  Eyes: EOM and lids are normal. Pupils are equal, round, and reactive to light.  Neck: Normal range of motion. Neck supple. Carotid bruit is not present.  Cardiovascular: Normal rate, regular rhythm, normal heart sounds,  intact distal pulses and normal pulses.   Pulmonary/Chest: Breath sounds normal. No respiratory distress.  Abdominal: Soft. Bowel sounds are normal. There is no tenderness. There is no guarding.  Musculoskeletal: Normal range of motion.       Left foot: There is tenderness. There is no deformity.       Feet:  Lymphadenopathy:       Head (right side): No submandibular adenopathy present.       Head (left side): No submandibular adenopathy present.    She has no cervical adenopathy.  Neurological: She is alert and oriented to person, place, and time. She has normal strength. No cranial nerve deficit or sensory deficit.   Skin: Skin is warm and dry.  Psychiatric: She has a normal mood and affect. Her speech is normal.  Nursing note and vitals reviewed.   ED Course  Procedures (including critical care time) Labs Review Labs Reviewed - No data to display  Imaging Review Dg Foot Complete Left  10/05/2015  CLINICAL DATA:  Larey Seat yesterday and injured left foot. EXAM: LEFT FOOT - COMPLETE 3+ VIEW COMPARISON:  None. FINDINGS: The joint spaces are maintained. No acute fractures identified. Prominent type 2 os naviculare noted. IMPRESSION: No acute fracture. Electronically Signed   By: Rudie Meyer M.D.   On: 10/05/2015 14:54   I have personally reviewed and evaluated these images and lab results as part of my medical decision-making.   EKG Interpretation None      MDM  The x-ray of the left foot is negative for fracture or dislocation. There no neurovascular compromise is appreciated. The patient is fitted with an ankle stirrup splint, and asked to follow-up with orthopedics if not improving.    Final diagnoses:  None    *I have reviewed nursing notes, vital signs, and all appropriate lab and imaging results for this patient.    Ivery Quale, PA-C 10/05/15 1638  Glynn Octave, MD 10/05/15 [phone]

## 2015-10-05 NOTE — Discharge Instructions (Signed)
Your x-ray is negative for fracture or dislocation. Please use Tylenol or ibuprofen for soreness. Please elevate your foot is much as possible. Please use the ankle splint for the next 7-10 days. Ankle Sprain An ankle sprain is an injury to the strong, fibrous tissues (ligaments) that hold the bones of your ankle joint together.  CAUSES An ankle sprain is usually caused by a fall or by twisting your ankle. Ankle sprains most commonly occur when you step on the outer edge of your foot, and your ankle turns inward. People who participate in sports are more prone to these types of injuries.  SYMPTOMS   Pain in your ankle. The pain may be present at rest or only when you are trying to stand or walk.  Swelling.  Bruising. Bruising may develop immediately or within 1 to 2 days after your injury.  Difficulty standing or walking, particularly when turning corners or changing directions. DIAGNOSIS  Your caregiver will ask you details about your injury and perform a physical exam of your ankle to determine if you have an ankle sprain. During the physical exam, your caregiver will press on and apply pressure to specific areas of your foot and ankle. Your caregiver will try to move your ankle in certain ways. An X-ray exam may be done to be sure a bone was not broken or a ligament did not separate from one of the bones in your ankle (avulsion fracture).  TREATMENT  Certain types of braces can help stabilize your ankle. Your caregiver can make a recommendation for this. Your caregiver may recommend the use of medicine for pain. If your sprain is severe, your caregiver may refer you to a surgeon who helps to restore function to parts of your skeletal system (orthopedist) or a physical therapist. HOME CARE INSTRUCTIONS   Apply ice to your injury for 1-2 days or as directed by your caregiver. Applying ice helps to reduce inflammation and pain.  Put ice in a plastic bag.  Place a towel between your skin and  the bag.  Leave the ice on for 15-20 minutes at a time, every 2 hours while you are awake.  Only take over-the-counter or prescription medicines for pain, discomfort, or fever as directed by your caregiver.  Elevate your injured ankle above the level of your heart as much as possible for 2-3 days.  If your caregiver recommends crutches, use them as instructed. Gradually put weight on the affected ankle. Continue to use crutches or a cane until you can walk without feeling pain in your ankle.  If you have a plaster splint, wear the splint as directed by your caregiver. Do not rest it on anything harder than a pillow for the first 24 hours. Do not put weight on it. Do not get it wet. You may take it off to take a shower or bath.  You may have been given an elastic bandage to wear around your ankle to provide support. If the elastic bandage is too tight (you have numbness or tingling in your foot or your foot becomes cold and blue), adjust the bandage to make it comfortable.  If you have an air splint, you may blow more air into it or let air out to make it more comfortable. You may take your splint off at night and before taking a shower or bath. Wiggle your toes in the splint several times per day to decrease swelling. SEEK MEDICAL CARE IF:   You have rapidly increasing bruising or swelling.  Your toes feel extremely cold or you lose feeling in your foot.  Your pain is not relieved with medicine. SEEK IMMEDIATE MEDICAL CARE IF:  Your toes are numb or blue.  You have severe pain that is increasing. MAKE SURE YOU:   Understand these instructions.  Will watch your condition.  Will get help right away if you are not doing well or get worse.   This information is not intended to replace advice given to you by your health care provider. Make sure you discuss any questions you have with your health care provider.   Document Released: 06/26/2005 Document Revised: 07/17/2014 Document  Reviewed: 07/08/2011 Elsevier Interactive Patient Education [employer] Elsevier Inc.

## 2015-10-05 NOTE — ED Notes (Signed)
Someone fell on left foot yesterday playing.  Rates pain 9/10.

## 2015-12-28 ENCOUNTER — Encounter (HOSPITAL_COMMUNITY): Payer: Self-pay | Admitting: Emergency Medicine

## 2015-12-28 ENCOUNTER — Emergency Department (HOSPITAL_COMMUNITY)
Admission: EM | Admit: 2015-12-28 | Discharge: 2015-12-29 | Disposition: A | Discharge status: Home / Self Care | Payer: Medicaid Other | Attending: Emergency Medicine | Admitting: Emergency Medicine

## 2015-12-28 ENCOUNTER — Emergency Department (HOSPITAL_COMMUNITY): Payer: Medicaid Other

## 2015-12-28 DIAGNOSIS — R55 Syncope and collapse: Secondary | ICD-10-CM | POA: Diagnosis not present

## 2015-12-28 DIAGNOSIS — J45909 Unspecified asthma, uncomplicated: Secondary | ICD-10-CM | POA: Diagnosis not present

## 2015-12-28 DIAGNOSIS — F329 Major depressive disorder, single episode, unspecified: Secondary | ICD-10-CM | POA: Diagnosis not present

## 2015-12-28 DIAGNOSIS — F17210 Nicotine dependence, cigarettes, uncomplicated: Secondary | ICD-10-CM | POA: Diagnosis not present

## 2015-12-28 LAB — CBC
HCT: 41.3 % (ref 36.0–46.0)
Hemoglobin: 13.6 g/dL (ref 12.0–15.0)
MCH: 31.2 pg (ref 26.0–34.0)
MCHC: 32.9 g/dL (ref 30.0–36.0)
MCV: 94.7 fL (ref 78.0–100.0)
PLATELETS: 250 10*3/uL (ref 150–400)
RBC: 4.36 MIL/uL (ref 3.87–5.11)
RDW: 12.9 % (ref 11.5–15.5)
WBC: 10.4 10*3/uL (ref 4.0–10.5)

## 2015-12-28 LAB — BASIC METABOLIC PANEL
Anion gap: 7 (ref 5–15)
BUN: 9 mg/dL (ref 6–20)
CALCIUM: 9.7 mg/dL (ref 8.9–10.3)
CO2: 26 mmol/L (ref 22–32)
CREATININE: 0.66 mg/dL (ref 0.44–1.00)
Chloride: 105 mmol/L (ref 101–111)
GFR calc non Af Amer: >60 mL/min (ref >60)
Glucose, Bld: 92 mg/dL (ref 65–99)
Potassium: 3.9 mmol/L (ref 3.5–5.1)
SODIUM: 138 mmol/L (ref 135–145)

## 2015-12-28 NOTE — ED Notes (Signed)
Pt had brief syncopal episode and friends started cpr on her. Pt does not remember passing out, but c/o itching to legs and chest soreness.

## 2015-12-29 ENCOUNTER — Other Ambulatory Visit: Payer: Self-pay

## 2015-12-29 LAB — URINALYSIS, ROUTINE W REFLEX MICROSCOPIC
BILIRUBIN URINE: NEGATIVE
GLUCOSE, UA: NEGATIVE mg/dL
HGB URINE DIPSTICK: NEGATIVE
KETONES UR: NEGATIVE mg/dL
Leukocytes, UA: NEGATIVE
Nitrite: NEGATIVE
PH: 7 (ref 5.0–8.0)
PROTEIN: NEGATIVE mg/dL
Specific Gravity, Urine: 1.015 (ref 1.005–1.030)

## 2015-12-29 LAB — RAPID URINE DRUG SCREEN, HOSP PERFORMED
Amphetamines: NOT DETECTED
BARBITURATES: NOT DETECTED
Benzodiazepines: NOT DETECTED
COCAINE: NOT DETECTED
Opiates: NOT DETECTED
TETRAHYDROCANNABINOL: NOT DETECTED

## 2015-12-29 LAB — PREGNANCY, URINE: PREG TEST UR: NEGATIVE

## 2015-12-29 LAB — TROPONIN I: Troponin I: <0.03 ng/mL (ref <0.031)

## 2015-12-29 NOTE — Discharge Instructions (Signed)

## 2015-12-29 NOTE — ED Provider Notes (Signed)
CSN: [id number]     Arrival date & time 12/28/15  2200 History   First MD Initiated Contact with Patient 12/28/15 2337     Chief Complaint  Patient presents with  . Near Syncope     (Consider location/radiation/quality/duration/timing/severity/associated sxs/prior Treatment) Patient is a 20 y.o. female presenting with near-syncope. The history is provided by the patient.  Near Syncope This is a new problem. The current episode started today. The problem has been resolved. Pertinent negatives include no abdominal pain. Nothing aggravates the symptoms. She has tried nothing for the symptoms. The treatment provided no relief.  Pt reports she has several bites on her legs.  Pt reports she at at [employer].  Mother reports patient passed out in the car and brother in law did cpr,  Breathing and chest compressions.  Pt complains of chest soreness.  Pt denies pregnancy, denies drug use  Past Medical History  Diagnosis Date  . Asthma   . Pneumonia     episode as a child  . MR (mental retardation)   . Depression   . Right knee pain    Past Surgical History  Procedure Laterality Date  . Myringotomy     Family History  Problem Relation Age of Onset  . Diabetes Other   . Heart failure Other   . Diabetes Mother   . Hypertension Mother   . Hyperlipidemia Mother   . Fibromyalgia Mother    Social History  Substance Use Topics  . Smoking status: Current Every Day Smoker -- 1.00 packs/day for 7 years    Types: Cigarettes  . Smokeless tobacco: Never Used  . Alcohol Use: No   OB History    No data available     Review of Systems  Cardiovascular: Positive for near-syncope.  Gastrointestinal: Negative for abdominal pain.  All other systems reviewed and are negative.     Allergies  Sulfa antibiotics  Home Medications   Prior to Admission medications   Medication Sig Start Date End Date Taking? Authorizing Provider  albuterol (PROVENTIL HFA;VENTOLIN HFA) 108 (90 Base) MCG/ACT  inhaler Inhale 1-2 puffs into the lungs every 6 (six) hours as needed for wheezing or shortness of breath. 09/06/15   Tammy Triplett, PA-C  guaiFENesin-codeine (ROBITUSSIN AC) 100-10 MG/5ML syrup Take 10 mLs by mouth 3 (three) times daily as needed. Patient not taking: Reported on 10/05/2015 09/06/15   Tammy Triplett, PA-C  naproxen (NAPROSYN) 500 MG tablet Take 1 tablet (500 mg total) by mouth 2 (two) times daily with a meal. Patient not taking: Reported on 10/05/2015 09/06/15   Tammy Triplett, PA-C  phenazopyridine (PYRIDIUM) 200 MG tablet Take 1 tablet (200 mg total) by mouth 3 (three) times daily. Patient not taking: Reported on 10/05/2015 07/01/15   Janne Napoleon, NP   BP 119/79 mmHg  Pulse 89  Temp(Src) 98.6 F (37 C) (Oral)  Resp 18  Ht  (1.6 m)  Wt 47.628 kg  BMI 18.60 kg/m2  SpO2 100%  LMP 12/21/2015 Physical Exam  Constitutional: She is oriented to person, place, and time. She appears well-developed and well-nourished.  HENT:  Head: Normocephalic and atraumatic.  Eyes: Conjunctivae and EOM are normal. Pupils are equal, round, and reactive to light.  Neck: Normal range of motion.  Cardiovascular: Normal rate and normal heart sounds.   Pulmonary/Chest: Effort normal.  Abdominal: Soft. She exhibits no distension.  Musculoskeletal: Normal range of motion.  Neurological: She is alert and oriented to person, place, and time.  Skin: Skin  is warm.  Psychiatric: She has a normal mood and affect.  Nursing note and vitals reviewed.   ED Course  Procedures (including critical care time) Labs Review Labs Reviewed  BASIC METABOLIC PANEL  CBC  TROPONIN I  URINALYSIS, ROUTINE W REFLEX MICROSCOPIC (NOT AT [facility])  URINE RAPID DRUG SCREEN, HOSP PERFORMED  PREGNANCY, URINE  CBG MONITORING, ED    Imaging Review No results found. I have personally reviewed and evaluated these images and lab results as part of my medical decision-making.   EKG Interpretation None      MDM Pt  has normal vital signs.  Pt has normal EKg and normal chest xray,  Labs are normal.     Final diagnoses:  Syncope, unspecified syncope type        Elson AreasLeslie K Bashar Milam, PA-C 12/29/15 [id number]  Devoria AlbeIva Knapp, MD 12/29/15 [phone]

## 2017-10-04 ENCOUNTER — Encounter: Payer: Self-pay | Admitting: Family Medicine

## 2017-10-04 ENCOUNTER — Ambulatory Visit (INDEPENDENT_AMBULATORY_CARE_PROVIDER_SITE_OTHER): Payer: Medicaid Other | Admitting: Family Medicine

## 2017-10-04 VITALS — BP 117/82 | HR 90 | Temp 98.2°F | Ht 61.5 in | Wt 111.0 lb

## 2017-10-04 DIAGNOSIS — Z0000 Encounter for general adult medical examination without abnormal findings: Secondary | ICD-10-CM

## 2017-10-04 NOTE — Progress Notes (Signed)
BP 117/82   Pulse 90   Temp 98.2 F (36.8 C) (Oral)   Ht 5' 1.5" (1.562 m)   Wt 111 lb (50.3 kg)   SpO2 98%   BMI 20.63 kg/m    Subjective:    Patient ID: Yvonne Meza, female    DOB: June 13, 1996, 22 y.o.   MRN: [id number]  HPI: Yvonne Paneaima Forgione is a 22 y.o. female presenting on 10/04/2017 for Annual Exam   HPI Adult well exam and physical Patient is coming in today for adult well exam and physical.  She is planning on doing [employer] and needs a physical prior to doing [employer].  She denies any major health issues or concerns.  She does states she is sexually active currently with only females and is never been sexually active with males.  She says she has had at least 12 partners over the past 2-3 years.  She has not used any kind of protection.  We discussed STD testing and need for a Pap smear and she will schedule that back in another time because she does not want to do it today.  Relevant past medical, surgical, family and social history reviewed and updated as indicated. Interim medical history since our last visit reviewed. Allergies and medications reviewed and updated.  Review of Systems  Constitutional: Negative for chills and fever.  HENT: Negative for ear pain and tinnitus.   Eyes: Negative for pain.  Respiratory: Negative for cough, shortness of breath and wheezing.   Cardiovascular: Negative for chest pain, palpitations and leg swelling.  Gastrointestinal: Negative for abdominal pain, blood in stool, constipation and diarrhea.  Genitourinary: Negative for dysuria and hematuria.  Musculoskeletal: Negative for back pain and myalgias.  Skin: Negative for rash.  Neurological: Negative for dizziness, weakness and headaches.  Psychiatric/Behavioral: Negative for suicidal ideas.    Per HPI unless specifically indicated above   Allergies as of 10/04/2017      Reactions   Sulfa Antibiotics Hives      Medication List    as of 10/04/2017 11:59  PM   You have not been prescribed any medications.        Objective:    BP 117/82   Pulse 90   Temp 98.2 F (36.8 C) (Oral)   Ht 5' 1.5" (1.562 m)   Wt 111 lb (50.3 kg)   SpO2 98%   BMI 20.63 kg/m   Wt Readings from Last 3 Encounters:  10/04/17 111 lb (50.3 kg)  12/28/15 105 lb (47.6 kg)  10/05/15 115 lb (52.2 kg)    Physical Exam  Constitutional: She is oriented to person, place, and time. She appears well-developed and well-nourished. No distress.  HENT:  Right Ear: External ear normal.  Left Ear: External ear normal.  Nose: Nose normal.  Mouth/Throat: Oropharynx is clear and moist.  Eyes: Conjunctivae are normal.  Neck: Neck supple. No thyromegaly present.  Cardiovascular: Normal rate, regular rhythm, normal heart sounds and intact distal pulses.  No murmur heard. Pulmonary/Chest: Effort normal and breath sounds normal. No respiratory distress. She has no wheezes.  Abdominal: Soft. She exhibits no distension and no mass. There is no tenderness.  Genitourinary: No breast swelling, tenderness, discharge or bleeding.  Genitourinary Comments: Patient declined  Musculoskeletal: Normal range of motion. She exhibits no edema.  Lymphadenopathy:    She has no cervical adenopathy.  Neurological: She is alert and oriented to person, place, and time. Coordination normal.  Skin: Skin is warm and dry. No rash  noted. She is not diaphoretic.  Psychiatric: She has a normal mood and affect. Her behavior is normal.  Nursing note and vitals reviewed.     Assessment & Plan:   Problem List Items Addressed This Visit    None    Visit Diagnoses    Well adult exam    -  Primary    Cleared for sports, no major health issues.  Recommend return for Pap smear  Follow up plan: Return if symptoms worsen or fail to improve, for Patient needs Pap smear.  Counseling provided for all of the vaccine components No orders of the defined types were placed in this encounter.   Arville Care, MD [facility] 10/08/2017, 2:20 PM

## 2017-10-25 ENCOUNTER — Other Ambulatory Visit: Payer: Medicaid Other | Admitting: Family

## 2017-11-01 ENCOUNTER — Other Ambulatory Visit: Payer: Medicaid Other | Admitting: Family

## 2017-11-12 ENCOUNTER — Other Ambulatory Visit: Payer: Medicaid Other | Admitting: Family Medicine

## 2017-11-16 ENCOUNTER — Encounter: Payer: Self-pay | Admitting: Family Medicine

## 2017-11-22 ENCOUNTER — Encounter: Payer: Medicaid Other | Admitting: Family Medicine

## 2017-12-25 ENCOUNTER — Encounter (HOSPITAL_COMMUNITY): Payer: Self-pay | Admitting: Emergency Medicine

## 2017-12-25 ENCOUNTER — Other Ambulatory Visit: Payer: Self-pay

## 2017-12-25 ENCOUNTER — Emergency Department (HOSPITAL_COMMUNITY)
Admission: EM | Admit: 2017-12-25 | Discharge: 2017-12-25 | Disposition: A | Discharge status: Home / Self Care | Payer: Medicaid Other | Attending: Emergency Medicine | Admitting: Emergency Medicine

## 2017-12-25 ENCOUNTER — Emergency Department (HOSPITAL_COMMUNITY): Payer: Medicaid Other

## 2017-12-25 DIAGNOSIS — M79644 Pain in right finger(s): Secondary | ICD-10-CM | POA: Diagnosis present

## 2017-12-25 DIAGNOSIS — F17210 Nicotine dependence, cigarettes, uncomplicated: Secondary | ICD-10-CM | POA: Insufficient documentation

## 2017-12-25 DIAGNOSIS — J45909 Unspecified asthma, uncomplicated: Secondary | ICD-10-CM | POA: Diagnosis not present

## 2017-12-25 MED ORDER — IBUPROFEN 600 MG PO TABS: 600.0000 mg | ORAL_TABLET | Freq: Four times a day (QID) | ORAL | 0 refills | Status: AC | PRN

## 2017-12-25 MED ORDER — ACETAMINOPHEN 500 MG PO TABS
1000.0000 mg | ORAL_TABLET | Freq: Once | ORAL | Status: AC
  Administered 2017-12-25: 1000 mg via ORAL
  Filled 2017-12-25: qty 2

## 2017-12-25 NOTE — ED Provider Notes (Signed)
[facility] Provider Note   CSN: [id number] Arrival date & time: 12/25/17  2121     History   Chief Complaint Chief Complaint  Patient presents with  . Finger Injury    HPI Yvonne Meza is a 22 y.o. left handed female that presents the emergent department today for right thumb pain.  Patient reports that she was attempting to open a [employer] large container of coffee and instead of pulling the lid back tried to punch her thumb through the seal packaging.  She reports that she felt a pop after puncturing through the sealed plastic and has since had pain at the base of her thumb there is worse with range of motion.  She rates her pain as a 9/10.  She has not taken anything for symptoms.  She denies any open wounds.  No numbness/tingling/weakness.  HPI  Past Medical History:  Diagnosis Date  . Asthma   . Depression   . MR (mental retardation)   . Pneumonia    episode as a child  . Right knee pain     Patient Active Problem List   Diagnosis Date Noted  . Unspecified asthma(493.90) 12/11/2012    Past Surgical History:  Procedure Laterality Date  . MYRINGOTOMY       OB History   None      Home Medications    Prior to Admission medications   Not on File    Family History Family History  Problem Relation Age of Onset  . Diabetes Mother   . Hypertension Mother   . Hyperlipidemia Mother   . Fibromyalgia Mother   . Diabetes Other   . Heart failure Other     Social History Social History   Tobacco Use  . Smoking status: Current Every Day Smoker    Packs/day: 1.00    Years: 7.00    Pack years: 7.00    Types: Cigarettes  . Smokeless tobacco: Never Used  Substance Use Topics  . Alcohol use: No    Alcohol/week: 0.0 oz  . Drug use: No     Allergies   Sulfa antibiotics   Review of Systems Review of Systems  Musculoskeletal: Positive for arthralgias. Negative for joint swelling.  Skin: Negative for  color change and wound.  Neurological: Negative for weakness and numbness.  All other systems reviewed and are negative.    Physical Exam Updated Vital Signs BP 133/86 (BP Location: Left Arm)   Pulse (!) 108   Temp 98.1 F (36.7 C) (Oral)   Resp 18   Ht 5\' 2"  (1.575 m)   Wt 50.8 kg (112 lb)   LMP 12/18/2017   SpO2 100%   BMI 20.49 kg/m   Physical Exam  Constitutional: She appears well-developed and well-nourished.  HENT:  Head: Normocephalic and atraumatic.  Right Ear: External ear normal.  Left Ear: External ear normal.  Eyes: Conjunctivae are normal. Right eye exhibits no discharge. Left eye exhibits no discharge. No scleral icterus.  Cardiovascular:  Pulses:      Radial pulses are 2+ on the right side, and 2+ on the left side.  Pulmonary/Chest: Effort normal. No respiratory distress.  Musculoskeletal:  Right hand: No gross deformities, skin intact. Fingers appear normal. No TTP over flexor sheath. TTP over base of the 1st digit. No snuffbox TTP. Finger adduction/abduction intact with 5/5 strength.  Thumb opposition intact. Full active and resisted ROM to flexion/extension at MCP and IP of thumb intact. FDS/FDP intact. Radial  artery 2+ with <2sec cap refill. SILT in M/U/R distributions. Grip 5/5 strength.   Neurological: She is alert. She has normal strength. No sensory deficit.  Skin: Skin is warm, dry and intact. Capillary refill takes less than 2 seconds. No abrasion and no laceration noted. No pallor.  Psychiatric: She has a normal mood and affect.  Nursing note and vitals reviewed.    ED Treatments / Results  Labs (all labs ordered are listed, but only abnormal results are displayed) Labs Reviewed - No data to display  EKG None  Radiology Dg Finger Thumb Right  Result Date: 12/25/2017 CLINICAL DATA:  Proximal right thumb pain after injury from a coffee canister. EXAM: RIGHT THUMB 2+V COMPARISON:  03/06/2013 FINDINGS: There is no evidence of fracture or  dislocation. There is no evidence of arthropathy or other focal bone abnormality. Soft tissues are unremarkable IMPRESSION: Negative. Electronically Signed   By: Burman Nieves M.D.   On: 12/25/2017 22:16    Procedures Procedures (including critical care time)  Medications Ordered in ED Medications  acetaminophen (TYLENOL) tablet 1,000 mg (has no administration in time range)     Initial Impression / Assessment and Plan / ED Course  I have reviewed the triage vital signs and the nursing notes.  Pertinent labs & imaging results that were available during my care of the patient were reviewed by me and considered in my medical decision making (see chart for details).     22 y.o. female with right, non-dominant thumb pain. Patient without evidence of tendon injury. She is NVI. Patient X-Ray negative for obvious fracture or dislocation. Pain managed in ED. Pt advised to follow up with orthopedics if symptoms persist for possibility of missed fracture diagnosis. Patient given thumb spica brace while in ED, conservative therapy recommended and discussed. Patient will be dc home & is agreeable with above plan. I advised the patient to follow-up with hand specialist if symptoms do not improve. Specific return precautions discussed. Time was given for all questions to be answered. The patient verbalized understanding and agreement with plan. The patient appears safe for discharge home.  Final Clinical Impressions(s) / ED Diagnoses   Final diagnoses:  Pain of right thumb    ED Discharge Orders        Ordered    ibuprofen (ADVIL,MOTRIN) 600 MG tablet  Every 6 hours PRN     12/25/17 2327       Princella Pellegrini 12/27/17 0334    Charlynne Pander, MD 12/31/17 [phone]

## 2017-12-25 NOTE — Discharge Instructions (Addendum)
Please read and follow all provided instructions.  You have been seen today for right thumb pain  Tests performed today include: An x-ray of the affected area - does NOT show any broken bones or dislocations.  Vital signs. See below for your results today.   Home care instructions: -- *PRICE in the first 24-48 hours after injury: Protect (with brace, splint, sling), if given by your provider Rest Ice- Do not apply ice pack directly to your skin, place towel or similar between your skin and ice/ice pack. Apply ice for 20 min, then remove for 40 min while awake Compression- Wear brace, elastic bandage, splint as directed by your provider Elevate affected extremity above the level of your heart when not walking around for the first 24-48 hours   Use Ibuprofen (Motrin/Advil) 600mg  every 6 hours as needed for pain (do not exceed max dose in 24 hours, 2400mg )  Follow-up instructions: Please follow-up with your primary care provider or the provided orthopedic physician (bone specialist) if you continue to have significant pain in 1 week. In this case you may have a more severe injury that requires further care.   Return instructions:  Please return if your toes or feet are numb or tingling, appear gray or blue, or you have severe pain (also elevate the leg and loosen splint or wrap if you were given one) Please return to the Emergency Department if you experience worsening symptoms.  Please return if you have any other emergent concerns. Additional Information:  Your vital signs today were: BP 133/86 (BP Location: Left Arm)    Pulse (!) 108    Temp 98.1 F (36.7 C) (Oral)    Resp 18    Ht 5\' 2"  (1.575 m)    Wt 50.8 kg (112 lb)    LMP 12/18/2017    SpO2 100%    BMI 20.49 kg/m  If your blood pressure (BP) was elevated above 135/85 this visit, please have this repeated by your doctor within one month. ---------------

## 2017-12-25 NOTE — ED Triage Notes (Signed)
Patient c/o right thumb pain, was trying to open a coffee canister and she poked her finger through. Limited ROM for right thumb with 9/10 pain. Has a small knot at the base of her thumb.

## 2017-12-25 NOTE — ED Notes (Signed)
Pt alert and oriented x4. Skin warm and dry. Pt complain of R hand pain. Pt able to move hand with difficulty. R fingers refill less that two seconds. R hand Velcro splint placed on right hand

## 2019-09-01 IMAGING — DX DG FINGER THUMB 2+V*R*
3 series · 3 of 3 positions shown · non-contrast
Comparison: 03/06/2013

CLINICAL DATA: Proximal right thumb pain after injury from a coffee
canister.

EXAM:
RIGHT THUMB 2+V

[finger obl]
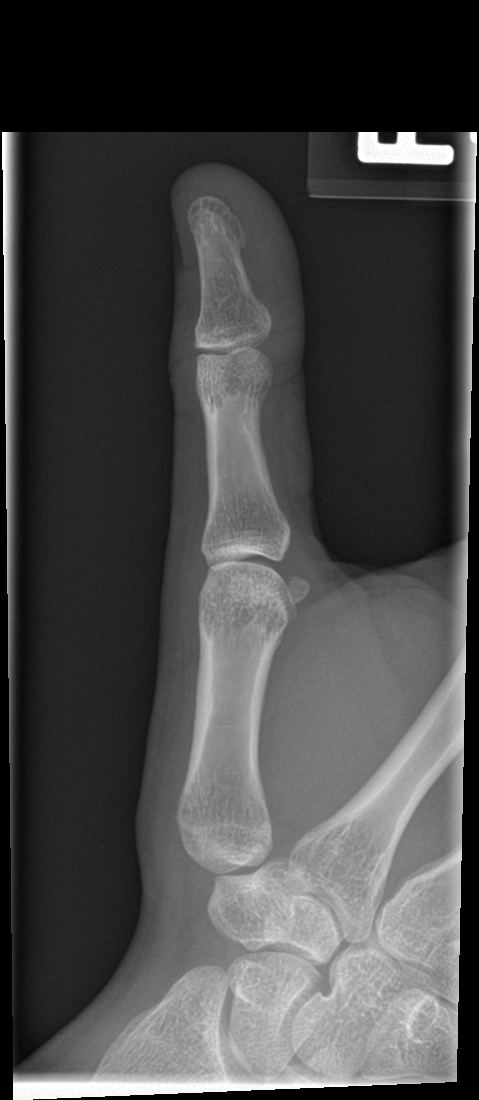

[finger lat]
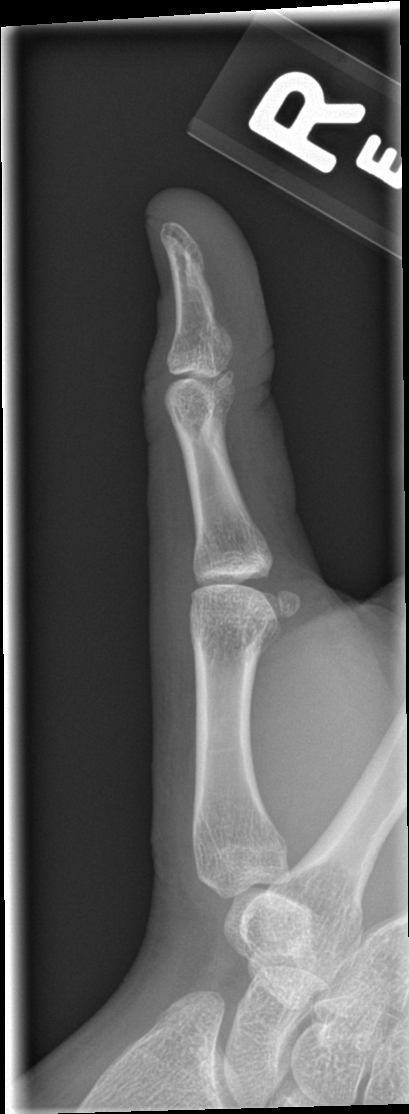

[finger ap]
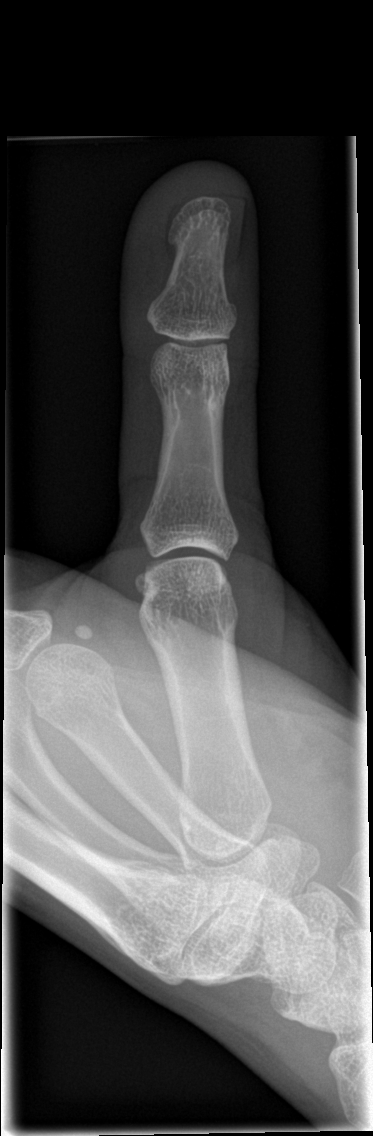

[3 of 3 positions shown; findings below may reference images not displayed]

FINDINGS: There is no evidence of fracture or dislocation. There is no
evidence of arthropathy or other focal bone abnormality. Soft
tissues are unremarkable
IMPRESSION: Negative.

## 2022-12-14 ENCOUNTER — Emergency Department (HOSPITAL_COMMUNITY)
Admission: EM | Admit: 2022-12-14 | Discharge: 2022-12-14 | Disposition: A | Discharge status: Home / Self Care | Payer: Medicaid Other | Attending: Emergency Medicine | Admitting: Emergency Medicine

## 2022-12-14 ENCOUNTER — Other Ambulatory Visit: Payer: Self-pay

## 2022-12-14 ENCOUNTER — Emergency Department (HOSPITAL_COMMUNITY): Payer: Medicaid Other

## 2022-12-14 ENCOUNTER — Encounter (HOSPITAL_COMMUNITY): Payer: Self-pay | Admitting: *Deleted

## 2022-12-14 DIAGNOSIS — W228XXA Striking against or struck by other objects, initial encounter: Secondary | ICD-10-CM | POA: Insufficient documentation

## 2022-12-14 DIAGNOSIS — Y9368 Activity, volleyball (beach) (court): Secondary | ICD-10-CM | POA: Diagnosis not present

## 2022-12-14 DIAGNOSIS — S0083XA Contusion of other part of head, initial encounter: Secondary | ICD-10-CM | POA: Insufficient documentation

## 2022-12-14 DIAGNOSIS — R42 Dizziness and giddiness: Secondary | ICD-10-CM | POA: Diagnosis not present

## 2022-12-14 DIAGNOSIS — S0990XA Unspecified injury of head, initial encounter: Secondary | ICD-10-CM

## 2022-12-14 HISTORY — DX: Post-traumatic stress disorder, unspecified: F43.10

## 2022-12-14 HISTORY — DX: Anxiety disorder, unspecified: F41.9

## 2022-12-14 LAB — POC URINE PREG, ED: Preg Test, Ur: NEGATIVE

## 2022-12-14 NOTE — ED Triage Notes (Signed)
Pt c/o fall while she was playing volleyball last week. Pt reports feeling dizzy and lightheaded since the fall but didn't have means of transportation to the hospital to be seen prior to today. Denies LOC.

## 2022-12-14 NOTE — ED Provider Notes (Signed)
[place] EMERGENCY DEPARTMENT AT [facility] Provider Note   CSN: [id number] Arrival date & time: 12/14/22  1007     History  Chief Complaint  Patient presents with   Dizziness    Yvonne Meza is a 27 y.o. female.  She is here with a complaint of persistent dizziness unsteadiness that is been going on for almost a week.  She said she hit her head on the floor while playing volleyball 6 days ago.  Since then she has felt dizzy and lightheaded.  Had multiple episodes of vomiting date of injury none since then.  Feels slightly unbalanced when walking.  No blurry vision double vision numbness or weakness.  Has tried nothing for her symptoms.  The history is provided by the patient.  Head Injury Location:  Frontal Mechanism of injury: fall   Fall:    Fall occurred:  Recreating/playing Pain details:    Severity:  No pain Relieved by:  None tried Worsened by:  Nothing Ineffective treatments:  None tried Associated symptoms: no double vision, no headaches, no loss of consciousness, no memory loss, no nausea, no neck pain and no vomiting        Home Medications Prior to Admission medications   Medication Sig Start Date End Date Taking? Authorizing Provider  ibuprofen (ADVIL,MOTRIN) 600 MG tablet Take 1 tablet (600 mg total) by mouth every 6 (six) hours as needed. 12/25/17   Maczis, Elmer Sow, PA-C      Allergies    Sulfa antibiotics    Review of Systems   Review of Systems  Eyes:  Negative for double vision.  Gastrointestinal:  Negative for nausea and vomiting.  Musculoskeletal:  Negative for neck pain.  Neurological:  Positive for dizziness and light-headedness. Negative for loss of consciousness and headaches.  Psychiatric/Behavioral:  Negative for memory loss.     Physical Exam Updated Vital Signs BP 117/74 (BP Location: Left Arm)   Pulse 79   Temp 98.1 F (36.7 C) (Oral)   Resp 16   Ht 5' 2.25" (1.581 m)   Wt 57.6 kg   LMP 12/07/2022 (Approximate)    SpO2 98%   BMI 23.04 kg/m  Physical Exam Vitals and nursing note reviewed.  Constitutional:      General: She is not in acute distress.    Appearance: Normal appearance. She is well-developed.  HENT:     Head: Normocephalic.     Comments: She has some bruising of her forehead Eyes:     Conjunctiva/sclera: Conjunctivae normal.  Cardiovascular:     Rate and Rhythm: Normal rate and regular rhythm.     Heart sounds: No murmur heard. Pulmonary:     Effort: Pulmonary effort is normal. No respiratory distress.     Breath sounds: Normal breath sounds.  Abdominal:     Palpations: Abdomen is soft.     Tenderness: There is no abdominal tenderness.  Musculoskeletal:        General: No deformity. Normal range of motion.     Cervical back: Neck supple.  Skin:    General: Skin is warm and dry.     Capillary Refill: Capillary refill takes less than 2 seconds.  Neurological:     General: No focal deficit present.     Mental Status: She is alert and oriented to person, place, and time.     Cranial Nerves: No cranial nerve deficit.     Sensory: No sensory deficit.     Motor: No weakness.  ED Results / Procedures / Treatments   Labs (all labs ordered are listed, but only abnormal results are displayed) Labs Reviewed  POC URINE PREG, ED    EKG None  Radiology CT Head Wo Contrast  Result Date: 12/14/2022 CLINICAL DATA:  Hit head on floor playing volleyball 5 days ago. Headache and fatigue since. EXAM: CT HEAD WITHOUT CONTRAST TECHNIQUE: Contiguous axial images were obtained from the base of the skull through the vertex without intravenous contrast. RADIATION DOSE REDUCTION: This exam was performed according to the departmental dose-optimization program which includes automated exposure control, adjustment of the mA and/or kV according to patient size and/or use of iterative reconstruction technique. COMPARISON:  None Available. FINDINGS: Brain: No evidence of acute infarction,  hemorrhage, hydrocephalus, extra-axial collection or mass lesion/mass effect. Vascular: No hyperdense vessel or unexpected calcification. Skull: Normal. Negative for fracture or focal lesion. Sinuses/Orbits: Globes and orbits are unremarkable. The visualized sinuses are clear. Other: None. IMPRESSION: Normal unenhanced CT scan of the brain. Electronically Signed   By: Amie Portland M.D.   On: 12/14/2022 11:44    Procedures Procedures    Medications Ordered in ED Medications - No data to display  ED Course/ Medical Decision Making/ A&P Clinical Course as of 12/14/22 1716  Thu Dec 14, 2022  1229 Patient denies any chance of pregnancy and does not feel she needs a pregnancy test. [MB]    Clinical Course User Index [MB] Terrilee Files, MD                             Medical Decision Making Amount and/or Complexity of Data Reviewed Labs: ordered. Radiology: ordered.   This patient complains of head injury persistent dizziness; this involves an extensive number of treatment Options and is a complaint that carries with it a high risk of complications and morbidity. The differential includes bleed, contusion, concussion  I ordered, reviewed and interpreted labs, which included pregnancy test negative I ordered imaging studies which included CT head and I independently    visualized and interpreted imaging which showed no acute findings Previous records obtained and reviewed in epic including prior ED and PCP visits Social determinants considered, no significant barriers Critical Interventions: None  After the interventions stated above, I reevaluated the patient and found patient to be neuro intact and in no distress Admission and further testing considered, no indications for admission or further workup at this time.  Given head injury instructions and concussion instructions, recommended close follow-up with PCP.  Return instructions discussed         Final Clinical  Impression(s) / ED Diagnoses Final diagnoses:  Dizziness  Injury of head, initial encounter    Rx / DC Orders ED Discharge Orders     None         Terrilee Files, MD 12/14/22 [phone]

## 2022-12-14 NOTE — Discharge Instructions (Addendum)
You are seen in the emergency department for continued dizziness after head injury.  You had a CAT scan that did not show any signs of bleeding or fracture.  Please rest, keep well-hydrated.  Follow-up with your primary care doctor.  Return to the emergency department if any worsening or concerning symptoms

## 2023-12-08 ENCOUNTER — Other Ambulatory Visit: Payer: Self-pay

## 2023-12-08 ENCOUNTER — Encounter (HOSPITAL_COMMUNITY): Payer: Self-pay

## 2023-12-08 ENCOUNTER — Emergency Department (HOSPITAL_COMMUNITY)
Admission: EM | Admit: 2023-12-08 | Discharge: 2023-12-08 | Discharge status: Against Medical Advice | Attending: Emergency Medicine | Admitting: Emergency Medicine

## 2023-12-08 DIAGNOSIS — Z5321 Procedure and treatment not carried out due to patient leaving prior to being seen by health care provider: Secondary | ICD-10-CM | POA: Diagnosis not present

## 2023-12-08 DIAGNOSIS — K0381 Cracked tooth: Secondary | ICD-10-CM | POA: Insufficient documentation

## 2023-12-08 DIAGNOSIS — K0889 Other specified disorders of teeth and supporting structures: Secondary | ICD-10-CM | POA: Diagnosis present

## 2023-12-08 NOTE — ED Triage Notes (Signed)
 Pt has broken top front teeth that the dentist was "trying to save" but they are painful and swollen.

## 2023-12-09 ENCOUNTER — Emergency Department (HOSPITAL_COMMUNITY)
Admission: EM | Admit: 2023-12-09 | Discharge: 2023-12-09 | Disposition: A | Discharge status: Home / Self Care | Attending: Emergency Medicine | Admitting: Emergency Medicine

## 2023-12-09 ENCOUNTER — Other Ambulatory Visit: Payer: Self-pay

## 2023-12-09 ENCOUNTER — Encounter (HOSPITAL_COMMUNITY): Payer: Self-pay

## 2023-12-09 DIAGNOSIS — K047 Periapical abscess without sinus: Secondary | ICD-10-CM | POA: Diagnosis not present

## 2023-12-09 DIAGNOSIS — K0889 Other specified disorders of teeth and supporting structures: Secondary | ICD-10-CM | POA: Diagnosis present

## 2023-12-09 MED ORDER — NAPROXEN 500 MG PO TABS: 500.0000 mg | ORAL_TABLET | Freq: Two times a day (BID) | ORAL | 0 refills | Status: DC

## 2023-12-09 MED ORDER — AMOXICILLIN 500 MG PO CAPS: 500.0000 mg | ORAL_CAPSULE | Freq: Three times a day (TID) | ORAL | 0 refills | Status: AC

## 2023-12-09 MED ORDER — AMOXICILLIN 250 MG PO CAPS
500.0000 mg | ORAL_CAPSULE | Freq: Once | ORAL | Status: AC
  Administered 2023-12-09: 500 mg via ORAL
  Filled 2023-12-09: qty 2

## 2023-12-09 NOTE — ED Notes (Signed)
 Pt/family received d/c paperwork at this time. After going over the paperwork any questions, comments, or concerns were answered to the best of this nurse's knowledge. The pt/family verbally acknowledged the teachings/instructions.

## 2023-12-09 NOTE — ED Provider Notes (Signed)
 [place] EMERGENCY DEPARTMENT AT [facility] Provider Note   CSN: [id number] Arrival date & time: 12/09/23  1702     History  Chief Complaint  Patient presents with   Dental Pain    Yvonne Meza is a 28 y.o. female.  Patient is a 28 year old female who presents emergency department the chief complaint of pain along the superior anterior aspect of the gumline.  Patient notes that symptoms have been ongoing for the past few days.  She notes that she has seen a dentist for this but is not currently on any antibiotics.  She notes that she is concerned that she may have an infection at this point.  She denies any stridor, dysphagia, drooling, dyspnea.  She has had no associated fevers.   Dental Pain      Home Medications Prior to Admission medications   Medication Sig Start Date End Date Taking? Authorizing Provider  ibuprofen  (ADVIL ,MOTRIN ) 600 MG tablet Take 1 tablet (600 mg total) by mouth every 6 (six) hours as needed. 12/25/17   Maczis, Michael M, PA-C      Allergies    Sulfa antibiotics    Review of Systems   Review of Systems  HENT:         Dental pain  All other systems reviewed and are negative.   Physical Exam Updated Vital Signs BP 120/68 (BP Location: Right Arm)   Pulse 76   Temp 98.4 F (36.9 C) (Oral)   Resp 20   Ht 5\' 2"  (1.575 m)   Wt 56.7 kg   LMP 12/01/2023 (Approximate)   SpO2 98%   BMI 22.86 kg/m  Physical Exam Vitals and nursing note reviewed.  Constitutional:      Appearance: Normal appearance.  HENT:     Head: Normocephalic and atraumatic.     Nose: Nose normal.     Mouth/Throat:     Mouth: Mucous membranes are moist.     Comments: Poor dentition noted throughout, swelling noted to the anterior superior gumline, no areas of induration or fluctuance, floor mouth is soft, tolerance crease without difficulty, no peritonsillar swelling Eyes:     Extraocular Movements: Extraocular movements intact.     Conjunctiva/sclera:  Conjunctivae normal.     Pupils: Pupils are equal, round, and reactive to light.  Cardiovascular:     Rate and Rhythm: Normal rate and regular rhythm.     Pulses: Normal pulses.  Pulmonary:     Effort: Pulmonary effort is normal. No respiratory distress.  Musculoskeletal:     Cervical back: Normal range of motion and neck supple.  Skin:    General: Skin is warm and dry.  Neurological:     General: No focal deficit present.     Mental Status: She is alert and oriented to person, place, and time. Mental status is at baseline.     ED Results / Procedures / Treatments   Labs (all labs ordered are listed, but only abnormal results are displayed) Labs Reviewed - No data to display  EKG None  Radiology No results found.  Procedures Procedures    Medications Ordered in ED Medications  amoxicillin (AMOXIL) capsule 500 mg (has no administration in time range)    ED Course/ Medical Decision Making/ A&P                                 Medical Decision Making Patient is doing well at this  time and is stable for discharge home.  Discussed with patient we will treat her for dental infection at this point.  Patient has no indication for acute peritonsillar abscess, Ludwig's angina, retropharyngeal abscess, epiglottitis.  She has stable vital signs with no indication for sepsis.  She has no signs of an obvious drainable abscess.  The need for close follow-up with her dentist was discussed as well as strict turn precautions for any new or worsening symptoms.  Patient voiced understanding and had no additional questions.  Risk Prescription drug management.           Final Clinical Impression(s) / ED Diagnoses Final diagnoses:  None    Rx / DC Orders ED Discharge Orders     None         Emmalene Hare 12/09/23 1725    Sueellen Emery, MD 12/09/23 1824

## 2023-12-09 NOTE — Discharge Instructions (Addendum)
 Please follow-up closely with your dentist on an outpatient basis.  Return to emergency department immediately for any new or worsening symptoms.

## 2023-12-09 NOTE — ED Triage Notes (Signed)
 Pt arrives with c/o front upper dental pain states that she has seen dentist already for same but thinks she may now have an infection.

## 2024-01-28 ENCOUNTER — Emergency Department (HOSPITAL_COMMUNITY)
Admission: EM | Admit: 2024-01-28 | Discharge: 2024-01-28 | Disposition: A | Discharge status: Home / Self Care | Attending: Emergency Medicine | Admitting: Emergency Medicine

## 2024-01-28 ENCOUNTER — Encounter (HOSPITAL_COMMUNITY): Payer: Self-pay | Admitting: *Deleted

## 2024-01-28 ENCOUNTER — Other Ambulatory Visit: Payer: Self-pay

## 2024-01-28 DIAGNOSIS — Z87891 Personal history of nicotine dependence: Secondary | ICD-10-CM | POA: Insufficient documentation

## 2024-01-28 DIAGNOSIS — W228XXA Striking against or struck by other objects, initial encounter: Secondary | ICD-10-CM | POA: Diagnosis not present

## 2024-01-28 DIAGNOSIS — Y9302 Activity, running: Secondary | ICD-10-CM | POA: Diagnosis not present

## 2024-01-28 DIAGNOSIS — J45909 Unspecified asthma, uncomplicated: Secondary | ICD-10-CM | POA: Insufficient documentation

## 2024-01-28 DIAGNOSIS — S0011XA Contusion of right eyelid and periocular area, initial encounter: Secondary | ICD-10-CM | POA: Insufficient documentation

## 2024-01-28 DIAGNOSIS — S0591XA Unspecified injury of right eye and orbit, initial encounter: Secondary | ICD-10-CM | POA: Diagnosis present

## 2024-01-28 MED ORDER — IBUPROFEN 400 MG PO TABS
600.0000 mg | ORAL_TABLET | Freq: Once | ORAL | Status: AC
  Administered 2024-01-28: 600 mg via ORAL
  Filled 2024-01-28: qty 2

## 2024-01-28 MED ORDER — FLUORESCEIN SODIUM 1 MG OP STRP
1.0000 | ORAL_STRIP | Freq: Once | OPHTHALMIC | Status: AC
  Administered 2024-01-28: 1 via OPHTHALMIC
  Filled 2024-01-28: qty 1

## 2024-01-28 MED ORDER — TETRACAINE HCL 0.5 % OP SOLN
1.0000 [drp] | Freq: Once | OPHTHALMIC | Status: AC
  Administered 2024-01-28: 1 [drp] via OPHTHALMIC
  Filled 2024-01-28: qty 4

## 2024-01-28 NOTE — ED Provider Notes (Signed)
  EMERGENCY DEPARTMENT AT [facility] Provider Note  CSN: [id number] Arrival date & time: 01/28/24 1319  Chief Complaint(s) Eye Pain  HPI Yvonne Meza is a 28 y.o. female without relevant past medical history presenting to the emergency department with eye pain.  Patient reports eye pain for few days, last week struck right eye against a wall when running.  Reports some blurry vision.  No nausea or vomiting.  No loss of vision.  No other injuries.  Denies other injury mechanism.   Past Medical History Past Medical History:  Diagnosis Date   Anxiety    Asthma    Depression    MR (mental retardation)    Pneumonia    episode as a child   PTSD (post-traumatic stress disorder)    Right knee pain    Patient Active Problem List   Diagnosis Date Noted   Asthma 12/11/2012   Home Medication(s) Prior to Admission medications   Medication Sig Start Date End Date Taking? Authorizing Provider  ibuprofen  (ADVIL ,MOTRIN ) 600 MG tablet Take 1 tablet (600 mg total) by mouth every 6 (six) hours as needed. 12/25/17   Maczis, Michael M, PA-C  naproxen  (NAPROSYN ) 500 MG tablet Take 1 tablet (500 mg total) by mouth 2 (two) times daily. 12/09/23   Daralene Lonni BIRCH, PA-C                                                                                                                                    Past Surgical History Past Surgical History:  Procedure Laterality Date   MYRINGOTOMY     Family History Family History  Problem Relation Age of Onset   Diabetes Mother    Hypertension Mother    Hyperlipidemia Mother    Fibromyalgia Mother    Diabetes Other    Heart failure Other     Social History Social History   Tobacco Use   Smoking status: Former    Current packs/day: 1.00    Average packs/day: 1 pack/day for 7.0 years (7.0 ttl pk-yrs)    Types: Cigarettes   Smokeless tobacco: Never  Vaping Use   Vaping status: Every Day  Substance Use Topics   Alcohol use:  Not Currently    Comment: occasionally   Drug use: No   Allergies Sulfa antibiotics  Review of Systems Review of Systems  All other systems reviewed and are negative.   Physical Exam Vital Signs  I have reviewed the triage vital signs BP 120/77 (BP Location: Right Arm)   Pulse 90   Temp 98.6 F (37 C) (Oral)   Resp 18   Ht 5' 2 (1.575 m)   Wt 54.4 kg   LMP 01/25/2024   SpO2 99%   BMI 21.95 kg/m  Physical Exam Vitals and nursing note reviewed.  Constitutional:      Appearance: Normal appearance.  HENT:     Head: Normocephalic and atraumatic.  Mouth/Throat:     Mouth: Mucous membranes are moist.  Eyes:     Extraocular Movements: Extraocular movements intact.     Conjunctiva/sclera: Conjunctivae normal.     Pupils: Pupils are equal, round, and reactive to light.     Comments: Right periorbital ecchymosis mild swelling.  No consensual photophobia.  No fluorescein  uptake.  Negative Seidel sign.  Intraocular pressure 20 left, 17 right.  Visual acuity reviewed  Cardiovascular:     Rate and Rhythm: Normal rate.  Pulmonary:     Effort: Pulmonary effort is normal. No respiratory distress.  Abdominal:     General: Abdomen is flat.  Musculoskeletal:        General: No deformity.  Skin:    General: Skin is warm and dry.     Capillary Refill: Capillary refill takes less than 2 seconds.  Neurological:     General: No focal deficit present.     Mental Status: She is alert. Mental status is at baseline.  Psychiatric:        Mood and Affect: Mood normal.        Behavior: Behavior normal.     ED Results and Treatments Labs (all labs ordered are listed, but only abnormal results are displayed) Labs Reviewed - No data to display                                                                                                                        Radiology No results found.  Pertinent labs & imaging results that were available during my care of the patient were  reviewed by me and considered in my medical decision making (see MDM for details).  Medications Ordered in ED Medications  ibuprofen  (ADVIL ) tablet 600 mg (has no administration in time range)  fluorescein  ophthalmic strip 1 strip (1 strip Both Eyes Given 01/28/24 1517)  tetracaine  (PONTOCAINE) 0.5 % ophthalmic solution 1 drop (1 drop Both Eyes Given 01/28/24 1517)                                                                                                                                     Procedures Procedures  (including critical care time)  Medical Decision Making / ED Course   MDM:  28 year old presenting to the emergency department eye pain after injury.  Suspect pain is due to surrounding ecchymosis/swelling.  Low concern for facial fracture.  No evidence of  corneal abrasion, ulcer, open globe on exam.  No sign of entrapment.  No consensual photophobia to suggest traumatic iritis.  No hyphema.  Examination is overall reassuring.  Visual acuity is only very slightly worse on the right.  Given reassuring examination, will discharge patient, recommended symptomatic relief.  Discussed reasons to return to the emergency department. Will discharge patient to home. All questions answered. Patient comfortable with plan of discharge. Return precautions discussed with patient and specified on the after visit summary.       Additional history obtained: -Additional history obtained from family -External records from outside source obtained and reviewed including: Chart review including previous notes, labs, imaging, consultation notes including prior notes      Medicines ordered and prescription drug management: Meds ordered this encounter  Medications   fluorescein  ophthalmic strip 1 strip   tetracaine  (PONTOCAINE) 0.5 % ophthalmic solution 1 drop   ibuprofen  (ADVIL ) tablet 600 mg    -I have reviewed the patients home medicines and have made adjustments as  needed  Reevaluation: After the interventions noted above, I reevaluated the patient and found that their symptoms have improved  Co morbidities that complicate the patient evaluation  Past Medical History:  Diagnosis Date   Anxiety    Asthma    Depression    MR (mental retardation)    Pneumonia    episode as a child   PTSD (post-traumatic stress disorder)    Right knee pain       Dispostion: Disposition decision including need for hospitalization was considered, and patient admitted to the hospital.    Final Clinical Impression(s) / ED Diagnoses Final diagnoses:  Periorbital ecchymosis of right eye, initial encounter     This chart was dictated using voice recognition software.  Despite best efforts to proofread,  errors can occur which can change the documentation meaning.    Francesca Elsie CROME, MD 01/28/24 [phone]

## 2024-01-28 NOTE — ED Triage Notes (Signed)
 Pt ran into a wall last week hitting her right eye on the wall  Pt has pain, swelling and bruising to eye  Pt states she has some changes in vision to the affected eye

## 2024-01-28 NOTE — Discharge Instructions (Addendum)
 We evaluated you for your eye pain.  Your testing in the emergency department is reassuring.  Your vision was only slightly worse in the right eye and your eye exam was otherwise reassuring.  We did not see any scrapes on your eye or signs of pressure buildup in your eye.  We suspect your pain is due to the bruising.  This should improve as the bruising resolves.  You can apply ice to the areas of swelling. Please take Tylenol  (acetaminophen ) and Motrin  (ibuprofen ) for your symptoms at home.  You can take 1000 mg of Tylenol  every 6 hours and 600 mg of Motrin  every 6 hours as needed for your symptoms.  You can take these medicines together as needed, either at the same time, or alternating every 3 hours.  Please follow-up with your primary doctor as needed.  If you have any new or worsening symptoms such as worsening or severe pain in your eye, loss of vision, nausea or vomiting, or any other concerning symptoms, please return to the emergency department for recheck.

## 2024-04-21 ENCOUNTER — Encounter (HOSPITAL_COMMUNITY): Payer: Self-pay

## 2024-04-21 ENCOUNTER — Emergency Department (HOSPITAL_COMMUNITY)
Admission: EM | Admit: 2024-04-21 | Discharge: 2024-04-21 | Disposition: A | Discharge status: Home / Self Care | Attending: Emergency Medicine | Admitting: Emergency Medicine

## 2024-04-21 ENCOUNTER — Emergency Department (HOSPITAL_COMMUNITY)

## 2024-04-21 ENCOUNTER — Other Ambulatory Visit: Payer: Self-pay

## 2024-04-21 DIAGNOSIS — B349 Viral infection, unspecified: Secondary | ICD-10-CM | POA: Diagnosis not present

## 2024-04-21 DIAGNOSIS — R059 Cough, unspecified: Secondary | ICD-10-CM | POA: Diagnosis present

## 2024-04-21 DIAGNOSIS — R197 Diarrhea, unspecified: Secondary | ICD-10-CM | POA: Insufficient documentation

## 2024-04-21 DIAGNOSIS — R1084 Generalized abdominal pain: Secondary | ICD-10-CM | POA: Diagnosis not present

## 2024-04-21 DIAGNOSIS — R112 Nausea with vomiting, unspecified: Secondary | ICD-10-CM | POA: Diagnosis not present

## 2024-04-21 LAB — COMPREHENSIVE METABOLIC PANEL WITH GFR
ALT: 12 U/L (ref 0–44)
AST: 19 U/L (ref 15–41)
Albumin: 4.6 g/dL (ref 3.5–5.0)
Alkaline Phosphatase: 77 U/L (ref 38–126)
Anion gap: 13 (ref 5–15)
BUN: 9 mg/dL (ref 6–20)
CO2: 22 mmol/L (ref 22–32)
Calcium: 9.8 mg/dL (ref 8.9–10.3)
Chloride: 101 mmol/L (ref 98–111)
Creatinine, Ser: 0.78 mg/dL (ref 0.44–1.00)
GFR, Estimated: >60 mL/min (ref >60)
Glucose, Bld: 118 mg/dL — ABNORMAL HIGH (ref 70–99)
Potassium: 3.7 mmol/L (ref 3.5–5.1)
Sodium: 135 mmol/L (ref 135–145)
Total Bilirubin: 0.6 mg/dL (ref 0.0–1.2)
Total Protein: 7.7 g/dL (ref 6.5–8.1)

## 2024-04-21 LAB — URINALYSIS, ROUTINE W REFLEX MICROSCOPIC
Bacteria, UA: NONE SEEN
Bilirubin Urine: NEGATIVE
Glucose, UA: NEGATIVE mg/dL
Ketones, ur: 20 mg/dL — AB
Leukocytes,Ua: NEGATIVE
Nitrite: NEGATIVE
Protein, ur: 100 mg/dL — AB
Specific Gravity, Urine: 1.027 (ref 1.005–1.030)
pH: 6 (ref 5.0–8.0)

## 2024-04-21 LAB — CBC WITH DIFFERENTIAL/PLATELET
Abs Immature Granulocytes: 0.08 K/uL — ABNORMAL HIGH (ref 0.00–0.07)
Basophils Absolute: 0 K/uL (ref 0.0–0.1)
Basophils Relative: 0 %
Eosinophils Absolute: 0 K/uL (ref 0.0–0.5)
Eosinophils Relative: 0 %
HCT: 41.9 % (ref 36.0–46.0)
Hemoglobin: 13.9 g/dL (ref 12.0–15.0)
Immature Granulocytes: 0 %
Lymphocytes Relative: 5 %
Lymphs Abs: 0.9 K/uL (ref 0.7–4.0)
MCH: 31.3 pg (ref 26.0–34.0)
MCHC: 33.2 g/dL (ref 30.0–36.0)
MCV: 94.4 fL (ref 80.0–100.0)
Monocytes Absolute: 0.6 K/uL (ref 0.1–1.0)
Monocytes Relative: 3 %
Neutro Abs: 17.2 K/uL — ABNORMAL HIGH (ref 1.7–7.7)
Neutrophils Relative %: 92 %
Platelets: 284 K/uL (ref 150–400)
RBC: 4.44 MIL/uL (ref 3.87–5.11)
RDW: 12.5 % (ref 11.5–15.5)
WBC: 18.8 K/uL — ABNORMAL HIGH (ref 4.0–10.5)
nRBC: 0 % (ref 0.0–0.2)

## 2024-04-21 LAB — RESP PANEL BY RT-PCR (RSV, FLU A&B, COVID)  RVPGX2
Influenza A by PCR: NEGATIVE
Influenza B by PCR: NEGATIVE
Resp Syncytial Virus by PCR: NEGATIVE
SARS Coronavirus 2 by RT PCR: NEGATIVE

## 2024-04-21 LAB — PREGNANCY, URINE: Preg Test, Ur: NEGATIVE

## 2024-04-21 LAB — LIPASE, BLOOD: Lipase: 11 U/L (ref 11–51)

## 2024-04-21 LAB — GROUP A STREP BY PCR: Group A Strep by PCR: NOT DETECTED

## 2024-04-21 MED ORDER — ONDANSETRON 4 MG PO TBDP: 4.0000 mg | ORAL_TABLET | Freq: Three times a day (TID) | ORAL | 0 refills | Status: DC | PRN

## 2024-04-21 MED ORDER — IOHEXOL 300 MG/ML  SOLN
100.0000 mL | Freq: Once | INTRAMUSCULAR | Status: AC | PRN
  Administered 2024-04-21: 100 mL via INTRAVENOUS

## 2024-04-21 MED ORDER — SODIUM CHLORIDE 0.9 % IV BOLUS
1000.0000 mL | Freq: Once | INTRAVENOUS | Status: AC
  Administered 2024-04-21: 1000 mL via INTRAVENOUS

## 2024-04-21 MED ORDER — DEXAMETHASONE SOD PHOSPHATE PF 10 MG/ML IJ SOLN
10.0000 mg | Freq: Once | INTRAMUSCULAR | Status: AC
  Administered 2024-04-21: 10 mg via INTRAVENOUS

## 2024-04-21 MED ORDER — NAPROXEN 500 MG PO TABS: 500.0000 mg | ORAL_TABLET | Freq: Two times a day (BID) | ORAL | 0 refills | Status: AC

## 2024-04-21 MED ORDER — KETOROLAC TROMETHAMINE 15 MG/ML IJ SOLN
15.0000 mg | Freq: Once | INTRAMUSCULAR | Status: AC
  Administered 2024-04-21: 15 mg via INTRAVENOUS
  Filled 2024-04-21: qty 1

## 2024-04-21 NOTE — Discharge Instructions (Signed)
 Please follow-up closely with your primary care doctor on an outpatient basis.  Return to emergency department immediately for any new or worsening symptoms.

## 2024-04-21 NOTE — ED Triage Notes (Signed)
 Pt arrived via pov c/o generalized abdominal pain since yesterday. Pt also reports N/V/D and loss of appetite.

## 2024-04-21 NOTE — ED Provider Notes (Signed)
 [place] EMERGENCY DEPARTMENT AT [facility] Provider Note   CSN: [id number] Arrival date & time: 04/21/24  1240     Patient presents with: Abdominal Pain   Yvonne Meza is a 28 y.o. female.   Patient is a 28 year old female who presents to the emergency department with a chief complaint of generalized bodyaches, generalized abdominal pain, nausea, vomiting, diarrhea, lack of appetite and sore throat which has been ongoing since yesterday.  Patient denies any known sick contacts.  She has had associated cough and congestion.  She denies any associated headaches, pain to neck or back.  She denies any associated dysuria or hematuria.   Abdominal Pain      Prior to Admission medications   Medication Sig Start Date End Date Taking? Authorizing Provider  ibuprofen  (ADVIL ,MOTRIN ) 600 MG tablet Take 1 tablet (600 mg total) by mouth every 6 (six) hours as needed. 12/25/17   Maczis, Michael M, PA-C  naproxen  (NAPROSYN ) 500 MG tablet Take 1 tablet (500 mg total) by mouth 2 (two) times daily. 12/09/23   Daralene Lonni BIRCH, PA-C    Allergies: Sulfa antibiotics    Review of Systems  Gastrointestinal:  Positive for abdominal pain.  All other systems reviewed and are negative.   Updated Vital Signs BP 107/74 (BP Location: Right Arm)   Pulse 87   Temp 97.8 F (36.6 C)   Resp 17   Ht 5' 2 (1.575 m)   Wt 56.2 kg   LMP 03/22/2024 (Approximate)   SpO2 97%   BMI 22.66 kg/m   Physical Exam Vitals and nursing note reviewed.  Constitutional:      General: She is not in acute distress.    Appearance: Normal appearance. She is not ill-appearing.  HENT:     Head: Normocephalic and atraumatic.     Nose: Nose normal.     Mouth/Throat:     Mouth: Mucous membranes are moist.  Eyes:     Extraocular Movements: Extraocular movements intact.     Conjunctiva/sclera: Conjunctivae normal.     Pupils: Pupils are equal, round, and reactive to light.  Cardiovascular:     Rate  and Rhythm: Normal rate and regular rhythm.     Pulses: Normal pulses.     Heart sounds: Normal heart sounds. No murmur heard.    No gallop.  Pulmonary:     Effort: Pulmonary effort is normal. No respiratory distress.     Breath sounds: Normal breath sounds. No stridor. No wheezing, rhonchi or rales.  Abdominal:     General: Abdomen is flat. Bowel sounds are normal. There is no distension. There are no signs of injury.     Palpations: Abdomen is soft.     Tenderness: There is generalized abdominal tenderness.     Hernia: No hernia is present.  Musculoskeletal:        General: Normal range of motion.     Cervical back: Normal range of motion and neck supple.  Skin:    General: Skin is warm and dry.     Coloration: Skin is not jaundiced.     Findings: No erythema or rash.  Neurological:     General: No focal deficit present.     Mental Status: She is alert and oriented to person, place, and time. Mental status is at baseline.  Psychiatric:        Mood and Affect: Mood normal.        Behavior: Behavior normal.  Thought Content: Thought content normal.        Judgment: Judgment normal.     (all labs ordered are listed, but only abnormal results are displayed) Labs Reviewed  CBC WITH DIFFERENTIAL/PLATELET - Abnormal; Notable for the following components:      Result Value   WBC 18.8 (*)    Neutro Abs 17.2 (*)    Abs Immature Granulocytes 0.08 (*)    All other components within normal limits  COMPREHENSIVE METABOLIC PANEL WITH GFR - Abnormal; Notable for the following components:   Glucose, Bld 118 (*)    All other components within normal limits  RESP PANEL BY RT-PCR (RSV, FLU A&B, COVID)  RVPGX2  GROUP A STREP BY PCR  LIPASE, BLOOD  PREGNANCY, URINE  URINALYSIS, ROUTINE W REFLEX MICROSCOPIC    EKG: None  Radiology: DG Chest 2 View Result Date: 04/21/2024 CLINICAL DATA:  Generalized abdominal pain and shortness of breath. EXAM: CHEST - 2 VIEW COMPARISON:   12/28/2015. FINDINGS: Trachea is midline. Heart size normal. Lungs are clear. No pleural fluid. IMPRESSION: Negative. Electronically Signed   By: Newell Eke M.D.   On: 04/21/2024 13:54     Procedures   Medications Ordered in the ED  sodium chloride  0.9 % bolus 1,000 mL (has no administration in time range)  ketorolac (TORADOL) 15 MG/ML injection 15 mg (has no administration in time range)  dexamethasone (DECADRON) injection 10 mg (has no administration in time range)                                    Medical Decision Making Amount and/or Complexity of Data Reviewed Labs: ordered. Radiology: ordered.  Risk Prescription drug management.   This patient presents to the ED for concern of abdominal pain, generalized malaise and fatigue, nausea, vomiting, diarrhea differential diagnosis includes viral gastroenteritis, infectious gastroenteritis, acute appendicitis, cholecystitis, small bowel obstruction, diverticulitis, ovarian torsion or cyst, PID, tubo-ovarian abscess, pyelonephritis, kidney stone, pancreatitis, mesenteric ischemia    Additional history obtained:  Additional history obtained from family External records from outside source obtained and reviewed including medical records   Lab Tests:  I Ordered, and personally interpreted labs.  The pertinent results include: Leukocytosis, no anemia, normal kidney function liver function, normal electrolytes, negative lipase, negative strep, negative viral swab, unremarkable urinalysis   Imaging Studies ordered:  I ordered imaging studies including chest x-ray, CT scan abdomen and pelvis I independently visualized and interpreted imaging which showed no acute cardiopulmonary process, no acute intra-abdominal surgical process I agree with the radiologist interpretation   Medicines ordered and prescription drug management:  I ordered medication including Toradol, Decadron, IV fluids for acute viral syndrome Reevaluation  of the patient after these medicines showed that the patient improved I have reviewed the patients home medicines and have made adjustments as needed   Problem List / ED Course:  Patient is doing very well at this time and is stable for discharge home.  Discussed with patient as her workup in the emergency department has been overall unremarkable.  Do suspect that her leukocytosis is secondary to demand from her recent vomiting.  No other acute surgical process was noted on CT scan of abdomen and pelvis.  She was negative for COVID-19, influenza, RSV and strep.  Suspect an acute viral syndrome at this point.  Will continue symptomatic treatment on outpatient basis.  Close follow-up with PCP was discussed as well as strict  turn precautions for any new or worsening symptoms.  Patient voiced understanding and had no additional questions.   Social Determinants of Health:  None        Final diagnoses:  None    ED Discharge Orders     None          Daralene Lonni JONETTA DEVONNA 04/21/24 1817    Suzette Pac, MD 04/22/24 1041

## 2024-05-20 ENCOUNTER — Emergency Department (HOSPITAL_COMMUNITY)
Admission: EM | Admit: 2024-05-20 | Discharge: 2024-05-20 | Disposition: A | Discharge status: Home / Self Care | Attending: Emergency Medicine | Admitting: Emergency Medicine

## 2024-05-20 ENCOUNTER — Encounter (HOSPITAL_COMMUNITY): Payer: Self-pay | Admitting: *Deleted

## 2024-05-20 ENCOUNTER — Other Ambulatory Visit: Payer: Self-pay

## 2024-05-20 ENCOUNTER — Emergency Department (HOSPITAL_COMMUNITY)

## 2024-05-20 DIAGNOSIS — J45909 Unspecified asthma, uncomplicated: Secondary | ICD-10-CM | POA: Diagnosis not present

## 2024-05-20 DIAGNOSIS — Z79899 Other long term (current) drug therapy: Secondary | ICD-10-CM | POA: Diagnosis not present

## 2024-05-20 DIAGNOSIS — R059 Cough, unspecified: Secondary | ICD-10-CM | POA: Diagnosis present

## 2024-05-20 DIAGNOSIS — J069 Acute upper respiratory infection, unspecified: Secondary | ICD-10-CM | POA: Diagnosis not present

## 2024-05-20 LAB — CBC WITH DIFFERENTIAL/PLATELET
Abs Immature Granulocytes: 0.04 K/uL (ref 0.00–0.07)
Basophils Absolute: 0 K/uL (ref 0.0–0.1)
Basophils Relative: 0 %
Eosinophils Absolute: 0 K/uL (ref 0.0–0.5)
Eosinophils Relative: 0 %
HCT: 39.4 % (ref 36.0–46.0)
Hemoglobin: 12.6 g/dL (ref 12.0–15.0)
Immature Granulocytes: 1 %
Lymphocytes Relative: 17 %
Lymphs Abs: 1.3 K/uL (ref 0.7–4.0)
MCH: 31.5 pg (ref 26.0–34.0)
MCHC: 32 g/dL (ref 30.0–36.0)
MCV: 98.5 fL (ref 80.0–100.0)
Monocytes Absolute: 0.7 K/uL (ref 0.1–1.0)
Monocytes Relative: 9 %
Neutro Abs: 5.5 K/uL (ref 1.7–7.7)
Neutrophils Relative %: 73 %
Platelets: 240 K/uL (ref 150–400)
RBC: 4 MIL/uL (ref 3.87–5.11)
RDW: 13.2 % (ref 11.5–15.5)
WBC: 7.6 K/uL (ref 4.0–10.5)
nRBC: 0 % (ref 0.0–0.2)

## 2024-05-20 LAB — URINALYSIS, ROUTINE W REFLEX MICROSCOPIC
Bilirubin Urine: NEGATIVE
Glucose, UA: NEGATIVE mg/dL
Ketones, ur: 20 mg/dL — AB
Leukocytes,Ua: NEGATIVE
Nitrite: NEGATIVE
Protein, ur: 30 mg/dL — AB
RBC / HPF: >50 RBC/hpf (ref 0–5)
Specific Gravity, Urine: 1.014 (ref 1.005–1.030)
pH: 5 (ref 5.0–8.0)

## 2024-05-20 LAB — COMPREHENSIVE METABOLIC PANEL WITH GFR
ALT: 8 U/L (ref 0–44)
AST: 15 U/L (ref 15–41)
Albumin: 4.3 g/dL (ref 3.5–5.0)
Alkaline Phosphatase: 76 U/L (ref 38–126)
Anion gap: 11 (ref 5–15)
BUN: 5 mg/dL — ABNORMAL LOW (ref 6–20)
CO2: 25 mmol/L (ref 22–32)
Calcium: 8.8 mg/dL — ABNORMAL LOW (ref 8.9–10.3)
Chloride: 102 mmol/L (ref 98–111)
Creatinine, Ser: 0.6 mg/dL (ref 0.44–1.00)
GFR, Estimated: >60 mL/min (ref >60)
Glucose, Bld: 80 mg/dL (ref 70–99)
Potassium: 3.7 mmol/L (ref 3.5–5.1)
Sodium: 139 mmol/L (ref 135–145)
Total Bilirubin: 0.3 mg/dL (ref 0.0–1.2)
Total Protein: 6.8 g/dL (ref 6.5–8.1)

## 2024-05-20 LAB — RESP PANEL BY RT-PCR (RSV, FLU A&B, COVID)  RVPGX2
Influenza A by PCR: NEGATIVE
Influenza B by PCR: NEGATIVE
Resp Syncytial Virus by PCR: NEGATIVE
SARS Coronavirus 2 by RT PCR: NEGATIVE

## 2024-05-20 LAB — PREGNANCY, URINE: Preg Test, Ur: NEGATIVE

## 2024-05-20 LAB — LIPASE, BLOOD: Lipase: 16 U/L (ref 11–51)

## 2024-05-20 MED ORDER — BENZONATATE 100 MG PO CAPS
200.0000 mg | ORAL_CAPSULE | Freq: Once | ORAL | Status: AC
  Administered 2024-05-20: 200 mg via ORAL
  Filled 2024-05-20: qty 2

## 2024-05-20 MED ORDER — ONDANSETRON 4 MG PO TBDP: 4.0000 mg | ORAL_TABLET | Freq: Three times a day (TID) | ORAL | 0 refills | Status: AC | PRN

## 2024-05-20 MED ORDER — DIPHENHYDRAMINE HCL 50 MG/ML IJ SOLN
25.0000 mg | Freq: Once | INTRAMUSCULAR | Status: AC
  Administered 2024-05-20: 25 mg via INTRAVENOUS
  Filled 2024-05-20: qty 1

## 2024-05-20 MED ORDER — SODIUM CHLORIDE 0.9 % IV BOLUS
1000.0000 mL | Freq: Once | INTRAVENOUS | Status: AC
  Administered 2024-05-20: 1000 mL via INTRAVENOUS

## 2024-05-20 MED ORDER — ACETAMINOPHEN 500 MG PO TABS
1000.0000 mg | ORAL_TABLET | Freq: Once | ORAL | Status: AC
  Administered 2024-05-20: 1000 mg via ORAL
  Filled 2024-05-20: qty 2

## 2024-05-20 MED ORDER — METOCLOPRAMIDE HCL 5 MG/ML IJ SOLN
10.0000 mg | Freq: Once | INTRAMUSCULAR | Status: AC
  Administered 2024-05-20: 10 mg via INTRAVENOUS
  Filled 2024-05-20: qty 2

## 2024-05-20 MED ORDER — ONDANSETRON HCL 4 MG/2ML IJ SOLN
4.0000 mg | Freq: Once | INTRAMUSCULAR | Status: AC
  Administered 2024-05-20: 4 mg via INTRAVENOUS
  Filled 2024-05-20: qty 2

## 2024-05-20 MED ORDER — BENZONATATE 100 MG PO CAPS: 100.0000 mg | ORAL_CAPSULE | Freq: Three times a day (TID) | ORAL | 0 refills | Status: AC

## 2024-05-20 MED ORDER — KETOROLAC TROMETHAMINE 15 MG/ML IJ SOLN
15.0000 mg | Freq: Once | INTRAMUSCULAR | Status: AC
  Administered 2024-05-20: 15 mg via INTRAVENOUS
  Filled 2024-05-20: qty 1

## 2024-05-20 MED ORDER — ONDANSETRON 4 MG PO TBDP
4.0000 mg | ORAL_TABLET | Freq: Once | ORAL | Status: DC
Start: 2024-05-20 — End: 2024-05-20

## 2024-05-20 NOTE — ED Provider Notes (Signed)
 [place] EMERGENCY DEPARTMENT AT [facility] Provider Note   CSN: [id number] Arrival date & time: 05/20/24  1535     Patient presents with: Emesis   Yvonne Meza is a 28 y.o. female.  She reports 3 days of sore throat, cough, body aches, headache, generalized weakness with nausea and vomiting.  She reports she has some pain in her abdomen but only with coughing and feels sore.  No pain with eating.  She has vomited several times denies hematemesis, no diarrhea.  No sick contacts, no chest pain, no shortness of breath.  She took over-the-counter occasions for her symptoms without significant relief.    Emesis      Prior to Admission medications   Medication Sig Start Date End Date Taking? Authorizing Provider  benzonatate (TESSALON) 100 MG capsule Take 1 capsule (100 mg total) by mouth every 8 (eight) hours. 05/20/24  Yes Sabena Winner A, PA-C  ibuprofen  (ADVIL ,MOTRIN ) 600 MG tablet Take 1 tablet (600 mg total) by mouth every 6 (six) hours as needed. 12/25/17   Maczis, Michael M, PA-C  naproxen  (NAPROSYN ) 500 MG tablet Take 1 tablet (500 mg total) by mouth 2 (two) times daily. 04/21/24   Daralene Lonni BIRCH, PA-C  ondansetron  (ZOFRAN -ODT) 4 MG disintegrating tablet Take 1 tablet (4 mg total) by mouth every 8 (eight) hours as needed for nausea or vomiting. 05/20/24   Suellen Cantor A, PA-C    Allergies: Sulfa antibiotics    Review of Systems  Gastrointestinal:  Positive for vomiting.    Updated Vital Signs BP 114/73   Pulse 69   Temp 98.5 F (36.9 C) (Oral)   Resp 18   Ht 5' 2 (1.575 m)   Wt 57.6 kg   LMP 05/19/2024 (Approximate)   SpO2 98%   BMI 23.23 kg/m   Physical Exam Vitals and nursing note reviewed.  Constitutional:      General: She is not in acute distress.    Appearance: She is well-developed.  HENT:     Head: Normocephalic and atraumatic.     Right Ear: Tympanic membrane normal.     Left Ear: Tympanic membrane normal.     Nose:  Nose normal.     Mouth/Throat:     Mouth: Mucous membranes are moist.     Pharynx: No oropharyngeal exudate or posterior oropharyngeal erythema.  Eyes:     Extraocular Movements: Extraocular movements intact.     Conjunctiva/sclera: Conjunctivae normal.     Pupils: Pupils are equal, round, and reactive to light.  Cardiovascular:     Rate and Rhythm: Normal rate and regular rhythm.     Heart sounds: No murmur heard. Pulmonary:     Effort: Pulmonary effort is normal. No respiratory distress.     Breath sounds: Normal breath sounds.  Abdominal:     General: There is no distension.     Palpations: Abdomen is soft. There is mass.     Tenderness: There is no abdominal tenderness. There is no right CVA tenderness, left CVA tenderness, guarding or rebound.     Hernia: No hernia is present.  Musculoskeletal:        General: No swelling.     Cervical back: Normal range of motion and neck supple. No rigidity or tenderness.  Skin:    General: Skin is warm and dry.     Capillary Refill: Capillary refill takes less than 2 seconds.  Neurological:     General: No focal deficit present.  Mental Status: She is alert and oriented to person, place, and time.  Psychiatric:        Mood and Affect: Mood normal.     (all labs ordered are listed, but only abnormal results are displayed) Labs Reviewed  COMPREHENSIVE METABOLIC PANEL WITH GFR - Abnormal; Notable for the following components:      Result Value   BUN 5 (*)    Calcium 8.8 (*)    All other components within normal limits  URINALYSIS, ROUTINE W REFLEX MICROSCOPIC - Abnormal; Notable for the following components:   APPearance HAZY (*)    Hgb urine dipstick LARGE (*)    Ketones, ur 20 (*)    Protein, ur 30 (*)    Bacteria, UA RARE (*)    All other components within normal limits  RESP PANEL BY RT-PCR (RSV, FLU A&B, COVID)  RVPGX2  CBC WITH DIFFERENTIAL/PLATELET  LIPASE, BLOOD  PREGNANCY, URINE    EKG: None  Radiology: DG  Chest 2 View Result Date: 05/20/2024 EXAM: 2 VIEW(S) XRAY OF THE CHEST 05/20/2024 07:33:00 PM COMPARISON: 04/21/2024 CLINICAL HISTORY: cough FINDINGS: LUNGS AND PLEURA: No focal pulmonary opacity. No pulmonary edema. No pleural effusion. No pneumothorax. HEART AND MEDIASTINUM: No acute abnormality of the cardiac and mediastinal silhouettes. BONES AND SOFT TISSUES: No acute osseous abnormality. IMPRESSION: 1. No acute cardiopulmonary process. Electronically signed by: Pinkie Pebbles MD 05/20/2024 07:35 PM EST RP Workstation: [id number]     Procedures   Medications Ordered in the ED  ondansetron  (ZOFRAN ) injection 4 mg (4 mg Intravenous Given 05/20/24 1947)  sodium chloride  0.9 % bolus 1,000 mL (1,000 mLs Intravenous New Bag/Given 05/20/24 1946)  ketorolac (TORADOL) 15 MG/ML injection 15 mg (15 mg Intravenous Given 05/20/24 2008)  acetaminophen  (TYLENOL ) tablet 1,000 mg (1,000 mg Oral Given 05/20/24 2203)  metoCLOPramide (REGLAN) injection 10 mg (10 mg Intravenous Given 05/20/24 2206)  diphenhydrAMINE  (BENADRYL ) injection 25 mg (25 mg Intravenous Given 05/20/24 2203)  benzonatate (TESSALON) capsule 200 mg (200 mg Oral Given 05/20/24 2202)                                    Medical Decision Making This patient presents to the ED for concern of sore throat, cough, congestion, body aches, nausea, this involves an extensive number of treatment options, and is a complaint that carries with it a high risk of complications and morbidity.  The differential diagnosis includes pneumonia, URI, viral illness, electrolyte disturbance, UTI, other   Co morbidities that complicate the patient evaluation :   Asthma, depression   Additional history obtained:  Additional history obtained from EMR External records from outside source obtained and reviewed including prior notes and labs   Lab Tests:  I Ordered, and personally interpreted labs.  The pertinent results include: CBC with no leukocytosis  or anemia, CMP with calcium 8.8 otherwise normal, UA shows large blood but no leukocytes, no nitrate, 16 white blood cells and rare bacteria.     Imaging Studies ordered:  I ordered imaging studies including x-ray chest which shows no pulmonary edema, no infiltrate I independently visualized and interpreted imaging within scope of identifying emergent findings  I agree with the radiologist interpretation    Problem List / ED Course / Critical interventions / Medication management  Cough and congestion for past approximate 4 days with occasional nausea and vomiting, no abdominal pain on exam.  Symptoms of the viral illness, labs  are reassuring, negative COVID flu RSV, chest x-ray is normal, cough improved with benzonatate, urine did show red blood cells, few white blood cells and rare bacteria but negative nitrate and leukocytes, no UTI symptoms and she is on her menstrual period which likely accounts for the blood.  No need for UTI treatment.  She does complain of headache ongoing for the past several days and as well.  She did not have any fevers or neck stiffness to suggest meningitis, states it feels like her typical migraine, it was not sudden onset.  Not feel she needs any imaging.  She reports that usually she would take Excedrin Migraine at home, here she was given Toradol, Tylenol , Reglan and Benadryl  and had much improvement.  She is feeling much better after the IV fluids, did discuss supportive care at home and follow-up with instructions.  She was given strict return precautions. I ordered medication as above Reevaluation of the patient after these medicines showed that the patient improved I have reviewed the patients home medicines and have made adjustments as needed       Amount and/or Complexity of Data Reviewed Labs: ordered. Radiology: ordered.  Risk OTC drugs. Prescription drug management.        Final diagnoses:  Upper respiratory tract infection,  unspecified type    ED Discharge Orders          Ordered    benzonatate (TESSALON) 100 MG capsule  Every 8 hours        05/20/24 2217    ondansetron  (ZOFRAN -ODT) 4 MG disintegrating tablet  Every 8 hours PRN        05/20/24 2220               Suellen Sherran DELENA DEVONNA 05/20/24 2224    Towana Ozell BROCKS, MD 05/21/24 1006

## 2024-05-20 NOTE — ED Triage Notes (Signed)
 Pt with c/o fever-highest 99.9. emesis, SOB, body aches and generalized weakness since Sunday. + HA

## 2024-05-20 NOTE — Discharge Instructions (Addendum)
 Is a pleasure take care of you today.  You are seen in the ER for cough, congestion, sore throat, body aches and headache.  Your symptoms are consistent with a viral illness, you were negative for COVID flu and RSV.  Your chest x-ray does not show pneumonia.    Your blood work otherwise reassuring, we gave you IV fluids and medication for your headache and are glad you are feeling somewhat better.  I am prescribing Tessalon Perles to help with your cough.  Follow-up close with your primary care doctor and come back to the ER if you have new or worsening symptoms.  You can use the Zofran  as needed for nausea.
